# Patient Record
Sex: Female | Born: 1980 | Race: Black or African American | Hispanic: No | Marital: Single | Smoking: Former smoker
Health system: Southern US, Community
[De-identification: ages and names within clinical notes are randomized; demographics above are authoritative.]

## PROBLEM LIST (undated history)

## (undated) DIAGNOSIS — G4730 Sleep apnea, unspecified: Secondary | ICD-10-CM

## (undated) DIAGNOSIS — G35 Multiple sclerosis: Secondary | ICD-10-CM

## (undated) DIAGNOSIS — I10 Essential (primary) hypertension: Secondary | ICD-10-CM

## (undated) DIAGNOSIS — M1990 Unspecified osteoarthritis, unspecified site: Secondary | ICD-10-CM

## (undated) DIAGNOSIS — E669 Obesity, unspecified: Secondary | ICD-10-CM

## (undated) DIAGNOSIS — E789 Disorder of lipoprotein metabolism, unspecified: Secondary | ICD-10-CM

## (undated) DIAGNOSIS — K219 Gastro-esophageal reflux disease without esophagitis: Secondary | ICD-10-CM

## (undated) DIAGNOSIS — G4733 Obstructive sleep apnea (adult) (pediatric): Secondary | ICD-10-CM

## (undated) HISTORY — DX: Gastro-esophageal reflux disease without esophagitis: K21.9

## (undated) HISTORY — DX: Disorder of lipoprotein metabolism, unspecified: E78.9

## (undated) HISTORY — DX: Obesity, unspecified: E66.9

## (undated) HISTORY — DX: Sleep apnea, unspecified: G47.30

## (undated) HISTORY — DX: Unspecified osteoarthritis, unspecified site: M19.90

## (undated) HISTORY — DX: Obstructive sleep apnea (adult) (pediatric): G47.33

## (undated) HISTORY — PX: OTHER SURGICAL HISTORY: SHX169

---

## 2003-07-26 ENCOUNTER — Emergency Department (HOSPITAL_COMMUNITY): Admission: EM | Admit: 2003-07-26 | Discharge: 2003-07-26 | Payer: Self-pay | Admitting: *Deleted

## 2005-12-09 DIAGNOSIS — G35 Multiple sclerosis: Secondary | ICD-10-CM

## 2005-12-09 HISTORY — DX: Multiple sclerosis: G35

## 2008-12-18 ENCOUNTER — Ambulatory Visit (HOSPITAL_BASED_OUTPATIENT_CLINIC_OR_DEPARTMENT_OTHER): Admission: RE | Admit: 2008-12-18 | Discharge: 2008-12-18 | Payer: Self-pay | Admitting: Family Medicine

## 2008-12-18 ENCOUNTER — Ambulatory Visit: Payer: Self-pay | Admitting: Family Medicine

## 2008-12-18 ENCOUNTER — Ambulatory Visit: Payer: Self-pay | Admitting: Diagnostic Radiology

## 2008-12-18 DIAGNOSIS — I8000 Phlebitis and thrombophlebitis of superficial vessels of unspecified lower extremity: Secondary | ICD-10-CM | POA: Insufficient documentation

## 2008-12-18 DIAGNOSIS — I10 Essential (primary) hypertension: Secondary | ICD-10-CM | POA: Insufficient documentation

## 2008-12-18 DIAGNOSIS — R252 Cramp and spasm: Secondary | ICD-10-CM | POA: Insufficient documentation

## 2008-12-18 DIAGNOSIS — R609 Edema, unspecified: Secondary | ICD-10-CM | POA: Insufficient documentation

## 2013-01-01 DIAGNOSIS — E559 Vitamin D deficiency, unspecified: Secondary | ICD-10-CM | POA: Insufficient documentation

## 2014-04-29 DIAGNOSIS — N915 Oligomenorrhea, unspecified: Secondary | ICD-10-CM | POA: Insufficient documentation

## 2015-09-18 DIAGNOSIS — R200 Anesthesia of skin: Secondary | ICD-10-CM | POA: Insufficient documentation

## 2015-09-18 DIAGNOSIS — G4726 Circadian rhythm sleep disorder, shift work type: Secondary | ICD-10-CM | POA: Insufficient documentation

## 2015-09-18 DIAGNOSIS — G5600 Carpal tunnel syndrome, unspecified upper limb: Secondary | ICD-10-CM | POA: Insufficient documentation

## 2015-09-18 DIAGNOSIS — R413 Other amnesia: Secondary | ICD-10-CM | POA: Insufficient documentation

## 2015-09-18 DIAGNOSIS — R5383 Other fatigue: Secondary | ICD-10-CM | POA: Insufficient documentation

## 2015-09-18 DIAGNOSIS — R2681 Unsteadiness on feet: Secondary | ICD-10-CM | POA: Insufficient documentation

## 2015-09-18 DIAGNOSIS — R202 Paresthesia of skin: Secondary | ICD-10-CM | POA: Insufficient documentation

## 2015-10-28 DIAGNOSIS — R110 Nausea: Secondary | ICD-10-CM | POA: Diagnosis not present

## 2015-10-28 DIAGNOSIS — I10 Essential (primary) hypertension: Secondary | ICD-10-CM | POA: Diagnosis not present

## 2015-10-28 DIAGNOSIS — E559 Vitamin D deficiency, unspecified: Secondary | ICD-10-CM | POA: Diagnosis not present

## 2015-11-21 DIAGNOSIS — I10 Essential (primary) hypertension: Secondary | ICD-10-CM | POA: Diagnosis not present

## 2015-11-21 DIAGNOSIS — Z0000 Encounter for general adult medical examination without abnormal findings: Secondary | ICD-10-CM | POA: Diagnosis not present

## 2015-11-21 DIAGNOSIS — Z6833 Body mass index (BMI) 33.0-33.9, adult: Secondary | ICD-10-CM | POA: Diagnosis not present

## 2015-11-21 DIAGNOSIS — Z124 Encounter for screening for malignant neoplasm of cervix: Secondary | ICD-10-CM | POA: Diagnosis not present

## 2016-03-18 DIAGNOSIS — G35 Multiple sclerosis: Secondary | ICD-10-CM | POA: Diagnosis not present

## 2016-03-18 DIAGNOSIS — G4730 Sleep apnea, unspecified: Secondary | ICD-10-CM | POA: Diagnosis not present

## 2016-03-18 DIAGNOSIS — R2681 Unsteadiness on feet: Secondary | ICD-10-CM | POA: Diagnosis not present

## 2016-03-26 DIAGNOSIS — G35 Multiple sclerosis: Secondary | ICD-10-CM | POA: Diagnosis not present

## 2016-07-05 DIAGNOSIS — J209 Acute bronchitis, unspecified: Secondary | ICD-10-CM | POA: Diagnosis not present

## 2016-07-05 DIAGNOSIS — L600 Ingrowing nail: Secondary | ICD-10-CM | POA: Diagnosis not present

## 2016-07-07 DIAGNOSIS — G35 Multiple sclerosis: Secondary | ICD-10-CM | POA: Diagnosis not present

## 2016-07-07 DIAGNOSIS — L03031 Cellulitis of right toe: Secondary | ICD-10-CM | POA: Diagnosis not present

## 2016-07-07 DIAGNOSIS — J018 Other acute sinusitis: Secondary | ICD-10-CM | POA: Diagnosis not present

## 2016-07-07 DIAGNOSIS — L97511 Non-pressure chronic ulcer of other part of right foot limited to breakdown of skin: Secondary | ICD-10-CM | POA: Diagnosis not present

## 2016-07-13 DIAGNOSIS — G35 Multiple sclerosis: Secondary | ICD-10-CM | POA: Diagnosis not present

## 2016-07-13 DIAGNOSIS — R200 Anesthesia of skin: Secondary | ICD-10-CM | POA: Diagnosis not present

## 2016-07-13 DIAGNOSIS — R202 Paresthesia of skin: Secondary | ICD-10-CM | POA: Diagnosis not present

## 2016-08-10 DIAGNOSIS — G35 Multiple sclerosis: Secondary | ICD-10-CM | POA: Diagnosis not present

## 2016-08-10 DIAGNOSIS — R200 Anesthesia of skin: Secondary | ICD-10-CM | POA: Diagnosis not present

## 2016-08-10 DIAGNOSIS — R2681 Unsteadiness on feet: Secondary | ICD-10-CM | POA: Diagnosis not present

## 2016-08-10 DIAGNOSIS — R202 Paresthesia of skin: Secondary | ICD-10-CM | POA: Diagnosis not present

## 2016-10-13 DIAGNOSIS — R5383 Other fatigue: Secondary | ICD-10-CM | POA: Diagnosis not present

## 2016-10-13 DIAGNOSIS — R110 Nausea: Secondary | ICD-10-CM | POA: Diagnosis not present

## 2016-11-21 ENCOUNTER — Emergency Department
Admission: EM | Admit: 2016-11-21 | Discharge: 2016-11-21 | Payer: BLUE CROSS/BLUE SHIELD | Source: Home / Self Care | Attending: Family Medicine | Admitting: Family Medicine

## 2016-11-21 DIAGNOSIS — M79605 Pain in left leg: Secondary | ICD-10-CM | POA: Diagnosis not present

## 2016-11-21 DIAGNOSIS — M79662 Pain in left lower leg: Secondary | ICD-10-CM

## 2016-11-21 DIAGNOSIS — I10 Essential (primary) hypertension: Secondary | ICD-10-CM | POA: Diagnosis not present

## 2016-11-21 DIAGNOSIS — M7989 Other specified soft tissue disorders: Secondary | ICD-10-CM | POA: Diagnosis not present

## 2016-11-21 DIAGNOSIS — G35 Multiple sclerosis: Secondary | ICD-10-CM | POA: Diagnosis not present

## 2016-11-21 DIAGNOSIS — Z87891 Personal history of nicotine dependence: Secondary | ICD-10-CM | POA: Diagnosis not present

## 2016-11-21 HISTORY — DX: Multiple sclerosis: G35

## 2016-11-21 HISTORY — DX: Essential (primary) hypertension: I10

## 2016-11-21 NOTE — ED Triage Notes (Signed)
Pt states pain started in left hip 1 week ago, has radiated into left calf and her calf has been swollen since Friday. Pt states it is getting harder to move leg. Pt denies left leg feeling warmer than her right and does wear compression stockings but hasn't since the swelling has increased.

## 2016-11-21 NOTE — ED Provider Notes (Signed)
[facility]    CSN: [id number] Arrival date & time: 11/21/16  1637     History   Chief Complaint Chief Complaint  Patient presents with  . Hip Pain  . Leg Pain    HPI Jean Yu is a 36 y.o. female.   Patient complains of onset of vague pain in left hip and anterior thigh one week ago.  No preceding injury.  Over the past several days the pain began to radiate down her left thigh to her knee.  Today she has developed vague pain and swelling in her left posterior calf to the ankle.  Her pain is worse when walking.  No shortness of breath or chest pain.   The history is provided by the patient.  Hip Pain  This is a new problem. Episode onset: 1 week ago. The problem occurs constantly. The problem has been rapidly worsening. Pertinent negatives include no chest pain, no headaches and no shortness of breath. The symptoms are aggravated by walking. Nothing relieves the symptoms. Treatments tried: leg elevation. The treatment provided no relief.  Leg Pain  Associated symptoms: fatigue   Associated symptoms: no fever     Past Medical History:  Diagnosis Date  . Hypertension   . MS (multiple sclerosis) (HCC) 12/09/2005    Patient Active Problem List   Diagnosis Date Noted  . HYPERTENSION 12/18/2008  . PHLEBITIS&THROMBOPHLEB SUP VESSELS LOWER EXTREM 12/18/2008  . CRAMP IN LIMB 12/18/2008  . EDEMA LEG 12/18/2008    History reviewed. No pertinent surgical history.  OB History    No data available       Home Medications    Prior to Admission medications   Medication Sig Start Date End Date Taking? Authorizing Provider  amLODipine-benazepril (LOTREL) 10-40 MG capsule Take 1 capsule by mouth daily.   Yes [provider]  cholecalciferol (VITAMIN D) 1000 units tablet Take 1,000 Units by mouth daily.   Yes [provider]  Dimethyl Fumarate (TECFIDERA) 240 MG CPDR Take by mouth.   Yes [provider]  metoprolol succinate  (TOPROL-XL) 50 MG 24 hr tablet Take 50 mg by mouth daily. Take with or immediately following a meal.   Yes [provider]  Multiple Vitamins-Minerals (HAIR/SKIN/NAILS) TABS Take by mouth.   Yes [provider]  triamterene-hydrochlorothiazide (DYAZIDE) 37.5-25 MG capsule Take 1 capsule by mouth daily.   Yes [provider]  vitamin B-12 (CYANOCOBALAMIN) 500 MCG tablet Take 500 mcg by mouth daily.   Yes [provider]    Family History Family History  Problem Relation Age of Onset  . Hypertension Mother     Social History Social History  Substance Use Topics  . Smoking status: Never Smoker  . Smokeless tobacco: Never Used  . Alcohol use 0.0 oz/week     Allergies   Patient has no known allergies.   Review of Systems Review of Systems  Constitutional: Positive for fatigue. Negative for chills, diaphoresis and fever.  HENT: Negative.   Eyes: Negative.   Respiratory: Negative for shortness of breath.   Cardiovascular: Negative for chest pain.  Gastrointestinal: Negative.   Genitourinary: Negative.   Musculoskeletal:       Left lower leg pain and swelling.  Skin: Negative.   Neurological: Negative for headaches.  All other systems reviewed and are negative.    Physical Exam Triage Vital Signs ED Triage Vitals  Enc Vitals Group     BP 11/21/16 1657 119/80     Pulse Rate  11/21/16 1657 75     Resp 11/21/16 1657 18     Temp 11/21/16 1657 98.6 F (37 C)     Temp Source 11/21/16 1657 Oral     SpO2 11/21/16 1657 97 %     Weight 11/21/16 1658 252 lb (114.3 kg)     Height 11/21/16 1658 5' 11.5" (1.816 m)     Head Circumference --      Peak Flow --      Pain Score 11/21/16 1659 8     Pain Loc --      Pain Edu? --      Excl. in GC? --    No data found.   Updated Vital Signs BP 119/80 (BP Location: Left Arm)   Pulse 75   Temp 98.6 F (37 C) (Oral)   Resp 18   Ht 5' 11.5" (1.816 m)   Wt 252 lb (114.3 kg)   LMP 11/17/2016    SpO2 97%   BMI 34.66 kg/m   Visual Acuity Right Eye Distance:   Left Eye Distance:   Bilateral Distance:    Right Eye Near:   Left Eye Near:    Bilateral Near:     Physical Exam  Constitutional: She appears well-nourished. No distress.  HENT:  Head: Normocephalic.  Nose: Nose normal.  Mouth/Throat: Oropharynx is clear and moist.  Eyes: Conjunctivae are normal. Pupils are equal, round, and reactive to light.  Neck: Neck supple.  Cardiovascular: Normal heart sounds.   Pulmonary/Chest: Breath sounds normal.  Abdominal: Soft. There is no tenderness.  Musculoskeletal: She exhibits tenderness.       Left lower leg: She exhibits tenderness. She exhibits no bony tenderness.       Legs: Left leg is tender to palpation and swollen from lower calf to proximal posterior thigh.  No erythema or warmth.  Pedal pulses are intact.  Neurological: She is alert.  Skin: Skin is warm and dry.  Nursing note and vitals reviewed.    UC Treatments / Results  Labs (all labs ordered are listed, but only abnormal results are displayed) Labs Reviewed - No data to display  EKG  EKG Interpretation None       Radiology No results found.  Procedures Procedures (including critical care time)  Medications Ordered in UC Medications - No data to display   Initial Impression / Assessment and Plan / UC Course  I have reviewed the triage vital signs and the nursing notes.  Pertinent labs & imaging results that were available during my care of the patient were reviewed by me and considered in my medical decision making (see chart for details).    Concern for possible DVT. Advised to proceed immediately to [facility] ED.    Final Clinical Impressions(s) / UC Diagnoses   Final diagnoses:  Pain in left lower leg    New Prescriptions New Prescriptions   No medications on file     Lattie Haw, MD 11/21/16 1759

## 2016-12-31 DIAGNOSIS — G4733 Obstructive sleep apnea (adult) (pediatric): Secondary | ICD-10-CM | POA: Diagnosis not present

## 2017-01-04 ENCOUNTER — Ambulatory Visit: Payer: Self-pay | Admitting: Physician Assistant

## 2017-01-19 DIAGNOSIS — G4733 Obstructive sleep apnea (adult) (pediatric): Secondary | ICD-10-CM | POA: Diagnosis not present

## 2017-01-19 DIAGNOSIS — R2681 Unsteadiness on feet: Secondary | ICD-10-CM | POA: Diagnosis not present

## 2017-01-19 DIAGNOSIS — G35 Multiple sclerosis: Secondary | ICD-10-CM | POA: Diagnosis not present

## 2017-01-20 ENCOUNTER — Ambulatory Visit: Payer: BLUE CROSS/BLUE SHIELD | Admitting: Physician Assistant

## 2017-02-07 ENCOUNTER — Ambulatory Visit: Payer: BLUE CROSS/BLUE SHIELD | Admitting: Physician Assistant

## 2017-03-14 ENCOUNTER — Ambulatory Visit: Payer: BLUE CROSS/BLUE SHIELD | Admitting: Physician Assistant

## 2017-03-29 ENCOUNTER — Encounter: Payer: Self-pay | Admitting: Physician Assistant

## 2017-03-29 ENCOUNTER — Ambulatory Visit (INDEPENDENT_AMBULATORY_CARE_PROVIDER_SITE_OTHER): Payer: BLUE CROSS/BLUE SHIELD | Admitting: Physician Assistant

## 2017-03-29 VITALS — BP 142/88 | HR 66 | Ht 72.0 in | Wt 250.0 lb

## 2017-03-29 DIAGNOSIS — Z23 Encounter for immunization: Secondary | ICD-10-CM | POA: Diagnosis not present

## 2017-03-29 DIAGNOSIS — M722 Plantar fascial fibromatosis: Secondary | ICD-10-CM

## 2017-03-29 DIAGNOSIS — I10 Essential (primary) hypertension: Secondary | ICD-10-CM | POA: Diagnosis not present

## 2017-03-29 DIAGNOSIS — G35D Multiple sclerosis, unspecified: Secondary | ICD-10-CM

## 2017-03-29 DIAGNOSIS — G35 Multiple sclerosis: Secondary | ICD-10-CM

## 2017-03-29 DIAGNOSIS — Z6833 Body mass index (BMI) 33.0-33.9, adult: Secondary | ICD-10-CM

## 2017-03-29 MED ORDER — MELOXICAM 15 MG PO TABS: 15.0000 mg | ORAL_TABLET | Freq: Every day | ORAL | 1 refills | Status: DC

## 2017-03-29 MED ORDER — METOPROLOL SUCCINATE ER 50 MG PO TB24: 50.0000 mg | ORAL_TABLET | Freq: Every day | ORAL | 1 refills | Status: DC

## 2017-03-29 MED ORDER — TRIAMTERENE-HCTZ 37.5-25 MG PO CAPS: 1.0000 | ORAL_CAPSULE | Freq: Every day | ORAL | 1 refills | Status: DC

## 2017-03-29 NOTE — Patient Instructions (Signed)
Plantar Fasciitis Plantar fasciitis is a painful foot condition that affects the heel. It occurs when the band of tissue that connects the toes to the heel bone (plantar fascia) becomes irritated. This can happen after exercising too much or doing other repetitive activities (overuse injury). The pain from plantar fasciitis can range from mild irritation to severe pain that makes it difficult for you to walk or move. The pain is usually worse in the morning or after you have been sitting or lying down for a while. What are the causes? This condition may be caused by:  Standing for long periods of time.  Wearing shoes that do not fit.  Doing high-impact activities, including running, aerobics, and ballet.  Being overweight.  Having an abnormal way of walking (gait).  Having tight calf muscles.  Having high arches in your feet.  Starting a new athletic activity.  What are the signs or symptoms? The main symptom of this condition is heel pain. Other symptoms include:  Pain that gets worse after activity or exercise.  Pain that is worse in the morning or after resting.  Pain that goes away after you walk for a few minutes.  How is this diagnosed? This condition may be diagnosed based on your signs and symptoms. Your health care provider will also do a physical exam to check for:  A tender area on the bottom of your foot.  A high arch in your foot.  Pain when you move your foot.  Difficulty moving your foot.  You may also need to have imaging studies to confirm the diagnosis. These can include:  X-rays.  Ultrasound.  MRI.  How is this treated? Treatment for plantar fasciitis depends on the severity of the condition. Your treatment may include:  Rest, ice, and over-the-counter pain medicines to manage your pain.  Exercises to stretch your calves and your plantar fascia.  A splint that holds your foot in a stretched, upward position while you sleep (night  splint).  Physical therapy to relieve symptoms and prevent problems in the future.  Cortisone injections to relieve severe pain.  Extracorporeal shock wave therapy (ESWT) to stimulate damaged plantar fascia with electrical impulses. It is often used as a last resort before surgery.  Surgery, if other treatments have not worked after 12 months.  Follow these instructions at home:  Take medicines only as directed by your health care provider.  Avoid activities that cause pain.  Roll the bottom of your foot over a bag of ice or a bottle of cold water. Do this for 20 minutes, 3-4 times a day.  Perform simple stretches as directed by your health care provider.  Try wearing athletic shoes with air-sole or gel-sole cushions or soft shoe inserts.  Wear a night splint while sleeping, if directed by your health care provider.  Keep all follow-up appointments with your health care provider. How is this prevented?  Do not perform exercises or activities that cause heel pain.  Consider finding low-impact activities if you continue to have problems.  Lose weight if you need to. The best way to prevent plantar fasciitis is to avoid the activities that aggravate your plantar fascia. Contact a health care provider if:  Your symptoms do not go away after treatment with home care measures.  Your pain gets worse.  Your pain affects your ability to move or do your daily activities. This information is not intended to replace advice given to you by your health care provider. Make sure you   discuss any questions you have with your health care provider. Document Released: 03/09/2001 Document Revised: 11/17/2015 Document Reviewed: 04/24/2014 Elsevier Interactive Patient Education  2018 [employer] Inc[employer]   Plantar Fasciitis Rehab Ask your health care provider which exercises are safe for you. Do exercises exactly as told by your health care provider and adjust them as directed. It is normal to feel  mild stretching, pulling, tightness, or discomfort as you do these exercises, but you should stop right away if you feel sudden pain or your pain gets worse. Do not begin these exercises until told by your health care provider. Stretching and range of motion exercises These exercises warm up your muscles and joints and improve the movement and flexibility of your foot. These exercises also help to relieve pain. Exercise A: Plantar fascia stretch  1. Sit with your left / right leg crossed over your opposite knee. 2. Hold your heel with one hand with that thumb near your arch. With your other hand, hold your toes and gently pull them back toward the top of your foot. You should feel a stretch on the bottom of your toes or your foot or both. 3. Hold this stretch for__________ seconds. 4. Slowly release your toes and return to the starting position. Repeat __________ times. Complete this exercise __________ times a day. Exercise B: Gastroc, standing  1. Stand with your hands against a wall. 2. Extend your left / right leg behind you, and bend your front knee slightly. 3. Keeping your heels on the floor and keeping your back knee straight, shift your weight toward the wall without arching your back. You should feel a gentle stretch in your left / right calf. 4. Hold this position for __________ seconds. Repeat __________ times. Complete this exercise __________ times a day. Exercise C: Soleus, standing 1. Stand with your hands against a wall. 2. Extend your left / right leg behind you, and bend your front knee slightly. 3. Keeping your heels on the floor, bend your back knee and slightly shift your weight over the back leg. You should feel a gentle stretch deep in your calf. 4. Hold this position for __________ seconds. Repeat __________ times. Complete this exercise __________ times a day. Exercise D: Gastrocsoleus, standing 1. Stand with the ball of your left / right foot on a step. The ball of  your foot is on the walking surface, right under your toes. 2. Keep your other foot firmly on the same step. 3. Hold onto the wall or a railing for balance. 4. Slowly lift your other foot, allowing your body weight to press your heel down over the edge of the step. You should feel a stretch in your left / right calf. 5. Hold this position for __________ seconds. 6. Return both feet to the step. 7. Repeat this exercise with a slight bend in your left / right knee. Repeat __________ times with your left / right knee straight and __________ times with your left / right knee bent. Complete this exercise __________ times a day. Balance exercise This exercise builds your balance and strength control of your arch to help take pressure off your plantar fascia. Exercise E: Single leg stand 1. Without shoes, stand near a railing or in a doorway. You may hold onto the railing or door frame as needed. 2. Stand on your left / right foot. Keep your big toe down on the floor and try to keep your arch lifted. Do not let your foot roll inward. 3. Hold   this position for __________ seconds. 4. If this exercise is too easy, you can try it with your eyes closed or while standing on a pillow. Repeat __________ times. Complete this exercise __________ times a day. This information is not intended to replace advice given to you by your health care provider. Make sure you discuss any questions you have with your health care provider. Document Released: 06/14/2005 Document Revised: 02/17/2016 Document Reviewed: 04/28/2015 Elsevier Interactive Patient Education  2018 Elsevier Inc.   

## 2017-03-29 NOTE — Progress Notes (Signed)
Subjective:    Patient ID: Jean Yu, female    DOB: 09-Nov-1980, 36 y.o.   MRN: [id number]  HPI  Pt is a 36 yo pleasant female who presents to the clinic to establish care.   Pt has a long hx of hypertension. She has been on numerous medications. Recently, about 2 months ago, she stopped taking amlodipine and benzapril. Her BP's have stayed under 140/90 when she checks at home. She denies any CP, palpitations, headaches. She is concerned about her cardiac health as her mother suddenly at 23 yo. She is working out 2 times and sometimes more a week at [employer]. She is trying to think about better food choices.  She would like to discuss weight loss in future.   She would like to discuss right foot pain today. Started when she woke up Saturday morning. Continues to get worse. Hurts when she bears any weight on it. Hurts worse if she trys to stand on tip toes. She is taking ibuprofen with little relief. No known injury but she has been working out more.  .. Active Ambulatory Problems    Diagnosis Date Noted  . Hypertension 12/18/2008  . MS (multiple sclerosis) (HCC) 12/09/2005  . BMI 33.0-33.9,adult 03/29/2017   Resolved Ambulatory Problems    Diagnosis Date Noted  . PHLEBITIS&THROMBOPHLEB SUP VESSELS LOWER EXTREM 12/18/2008  . CRAMP IN LIMB 12/18/2008  . EDEMA LEG 12/18/2008   Past Medical History:  Diagnosis Date  . Hypertension   . MS (multiple sclerosis) (HCC) 12/09/2005   .[name] Family History  Problem Relation Age of Onset  . Hypertension Mother   . Heart attack Mother   . Cancer Maternal Aunt        Review of Systems  All other systems reviewed and are negative.      Objective:   Physical Exam  Constitutional: She is oriented to person, place, and time. She appears well-developed and well-nourished.  Obese.   Cardiovascular: Normal rate, regular rhythm and normal heart sounds.   Pulmonary/Chest: Effort normal and breath sounds normal.  Musculoskeletal:   Pain to palpation over plantar fascia of right foot.  Pain with plantar flexion.  Pain with weight bearing.  NROM.   Neurological: She is alert and oriented to person, place, and time.  Psychiatric: She has a normal mood and affect. Her behavior is normal.          Assessment & Plan:  Marland KitchenMarland KitchenJaynae was seen today for establish care and hypertension.  Diagnoses and all orders for this visit:  Plantar fasciitis, right -     meloxicam (MOBIC) 15 MG tablet; Take 1 tablet (15 mg total) by mouth daily.  Influenza vaccine needed -     Flu Vaccine QUAD 6+ mos PF IM (Fluarix Quad PF)  Essential hypertension -     metoprolol succinate (TOPROL-XL) 50 MG 24 hr tablet; Take 1 tablet (50 mg total) by mouth daily. Take with or immediately following a meal. -     triamterene-hydrochlorothiazide (DYAZIDE) 37.5-25 MG capsule; Take 1 each (1 capsule total) by mouth daily.  MS (multiple sclerosis) (HCC)  BMI 33.0-33.9,adult   Stop ibuprofen. Start mobic. Discussed icing with frozen water bottle. Pt is wearing sandles today encouraged to get supportive shoe and wear all the time. Rest for next week. Consider injection/orthotics if not improving with sports medicine.   BP not quite to goal. Pt does not want to restart CCB and ACE. Close follow up on HTN in 4 weeks. Discussed  decreasing salt as well as weight loss.   [name].Discussed low carb diet with 1500 calories and 80g of protein.  Exercising at least 150 minutes a week.  [employer] could be a Chief Technology Officer.  Discussed medications will hold off for now and consider in near future if not to goal.

## 2017-03-31 DIAGNOSIS — G35 Multiple sclerosis: Secondary | ICD-10-CM | POA: Diagnosis not present

## 2017-03-31 DIAGNOSIS — G5603 Carpal tunnel syndrome, bilateral upper limbs: Secondary | ICD-10-CM | POA: Diagnosis not present

## 2017-03-31 DIAGNOSIS — G4733 Obstructive sleep apnea (adult) (pediatric): Secondary | ICD-10-CM | POA: Diagnosis not present

## 2017-04-01 ENCOUNTER — Institutional Professional Consult (permissible substitution): Payer: BLUE CROSS/BLUE SHIELD | Admitting: Sports Medicine

## 2017-04-29 ENCOUNTER — Ambulatory Visit: Payer: BLUE CROSS/BLUE SHIELD | Admitting: Physician Assistant

## 2017-05-04 ENCOUNTER — Encounter: Payer: Self-pay | Admitting: Physician Assistant

## 2017-05-04 ENCOUNTER — Ambulatory Visit: Payer: BLUE CROSS/BLUE SHIELD | Admitting: Physician Assistant

## 2017-05-04 VITALS — BP 118/80 | HR 76 | Wt 252.0 lb

## 2017-05-04 DIAGNOSIS — I10 Essential (primary) hypertension: Secondary | ICD-10-CM | POA: Diagnosis not present

## 2017-05-04 DIAGNOSIS — R079 Chest pain, unspecified: Secondary | ICD-10-CM | POA: Diagnosis not present

## 2017-05-04 DIAGNOSIS — G4733 Obstructive sleep apnea (adult) (pediatric): Secondary | ICD-10-CM

## 2017-05-04 DIAGNOSIS — Z8241 Family history of sudden cardiac death: Secondary | ICD-10-CM

## 2017-05-04 NOTE — Progress Notes (Signed)
HPI:                                                                Jean Yu is a 36 y.o. [facility] today for elevated blood pressure  HTN: taking Dyazide and Metoprolol daily. Compliant with medications. Reports she was sent home from work yesterday with BP of 180's/120 measured by nurse. Reports multiple high readings at home. BP range 127/86, 178/88, 160/112. Endorses headache, chest discomfort and lightheadedness. Denies vision change, focal weakness, slurred speech, orthopnea, syncope and edema. Risk factors include: OSA, obesity, + family history  Chest pain: this is a chronic problem, present for years. Described as left-sided "pressure." Intermittent, sometimes at rest and with minimal exertion. She is able to exercise without chest pain. Non-pleuritic. No associated diaphoresis, nausea, shortness of breath, palpitations or abnormal beats. She was evaluated by Cardiology 08/2015, reported similar chest pain at that time. She had a negative stress echo in 2015. Last lipid panel 10/12/2016: TC 288, HDL 78, TG 126, LDL 125  OSA: currently being followed by sleep medicine and has an upcoming sleep study with CPAP titration.   Past Medical History:  Diagnosis Date  . Borderline high cholesterol   . Hypertension   . MS (multiple sclerosis) (HCC) 12/09/2005  . OSA (obstructive sleep apnea)    No past surgical history on file. Social History   Tobacco Use  . Smoking status: Former Games developermoker  . Smokeless tobacco: Never Used  Substance Use Topics  . Alcohol use: Yes    Alcohol/week: 0.0 oz   family history includes Cancer in her maternal aunt; Heart attack in her mother; Hypertension in her mother.  ROS: negative except as noted in the HPI  Medications: Current Outpatient Medications  Medication Sig Dispense Refill  . cholecalciferol (VITAMIN D) 1000 units tablet Take 1,000 Units by mouth daily.    .  Dimethyl Fumarate (TECFIDERA) 240 MG CPDR Take by mouth.    . metoprolol succinate (TOPROL-XL) 50 MG 24 hr tablet Take 1 tablet (50 mg total) by mouth daily. Take with or immediately following a meal. 90 tablet 1  . triamterene-hydrochlorothiazide (DYAZIDE) 37.5-25 MG capsule Take 1 each (1 capsule total) by mouth daily. 90 capsule 1   No current facility-administered medications for this visit.    No Known Allergies     Objective:  BP 118/85   Pulse 83   Wt 252 lb (114.3 kg)   SpO2 97%   BMI 34.18 kg/m  Gen:  alert, not ill-appearing, no distress, appropriate for age, obese female HEENT: head normocephalic without obvious abnormality, conjunctiva and cornea clear, trachea midline Pulm: Normal work of breathing, normal phonation, clear to auscultation bilaterally, no wheezes, rales or rhonchi CV: Normal rate, regular rhythm, s1 and s2 distinct, no murmurs, clicks or rubs, no carotid bruit  Neuro: alert and oriented x 3, no tremor MSK: extremities atraumatic, normal gait and station Skin: intact, no rashes on exposed skin, no cyanosis  Depression screen PHQ 2/9 03/29/2017  Decreased Interest 0  Down, Depressed, Hopeless 0  PHQ - 2 Score 0     No results found for this or any previous visit (from the past 72 hour(s)). No results found.  ECG  05/04/2017 Vent Rate 74 bpm PR-I 160 ms QRS 94 ms QT/QTc 406/450 ms Normal sinus rhythm  Assessment and Plan: 35 y.o. female with   1. Essential hypertension - EKG 12-Lead  2. Elevated blood pressure reading with diagnosis of hypertension BP Readings from Last 3 Encounters:  05/04/17 118/80  03/29/17 (!) 142/88  11/21/16 119/80  - office readings in range x 2 - discussed treating her OSA will likely improve her hypertension - continue daily meds - counseled on therapeutic lifestyle changes - patient to log readings at home and bring cuff to her follow-up appointment  3. Obstructive sleep apnea - maintain follow-up with  sleep medicine  4. Family history of sudden cardiac death in mother  70. Chest pain with low risk of ACS - personally reviewed ECG showing NSR, no dysrhythmia, no AV block, no ST changes, no axis deviation - vital signs normal - longstanding history of atypical chest pain, negative stress echo in the last 5 years, no HLD or CAD - recommend follow-up with Cardiology to discuss possible repeat stress test  Patient education and anticipatory guidance given Patient agrees with treatment plan Follow-up with PCP in 3 weeks for hypertension or sooner as needed if symptoms worsen or fail to improve  Levonne Hubert PA-C

## 2017-05-04 NOTE — Patient Instructions (Addendum)
For your blood pressure: - Check blood pressure at home for the next 2 weeks - Check around the same time each day in a relaxed setting - Limit salt to <1500 mg/day - Follow DASH eating plan - limit alcohol to 1 standard drinks per day - avoid tobacco products - weight loss: 7% of current body weight - bring home readings in weeks

## 2017-05-25 ENCOUNTER — Ambulatory Visit: Payer: BLUE CROSS/BLUE SHIELD | Admitting: Physician Assistant

## 2017-06-17 ENCOUNTER — Ambulatory Visit: Payer: BLUE CROSS/BLUE SHIELD | Admitting: Physician Assistant

## 2017-06-17 DIAGNOSIS — Z0189 Encounter for other specified special examinations: Secondary | ICD-10-CM

## 2017-08-03 ENCOUNTER — Ambulatory Visit: Payer: BLUE CROSS/BLUE SHIELD | Admitting: Physician Assistant

## 2017-08-29 ENCOUNTER — Ambulatory Visit: Payer: BLUE CROSS/BLUE SHIELD | Admitting: Physician Assistant

## 2017-08-29 ENCOUNTER — Encounter: Payer: Self-pay | Admitting: Physician Assistant

## 2017-08-29 VITALS — BP 145/88 | HR 67 | Ht 72.0 in | Wt 255.0 lb

## 2017-08-29 DIAGNOSIS — Z79899 Other long term (current) drug therapy: Secondary | ICD-10-CM

## 2017-08-29 DIAGNOSIS — I10 Essential (primary) hypertension: Secondary | ICD-10-CM

## 2017-08-29 DIAGNOSIS — Z13220 Encounter for screening for lipoid disorders: Secondary | ICD-10-CM

## 2017-08-29 DIAGNOSIS — Z131 Encounter for screening for diabetes mellitus: Secondary | ICD-10-CM

## 2017-08-29 DIAGNOSIS — E6609 Other obesity due to excess calories: Secondary | ICD-10-CM | POA: Insufficient documentation

## 2017-08-29 DIAGNOSIS — Z6834 Body mass index (BMI) 34.0-34.9, adult: Secondary | ICD-10-CM | POA: Diagnosis not present

## 2017-08-29 DIAGNOSIS — N62 Hypertrophy of breast: Secondary | ICD-10-CM | POA: Insufficient documentation

## 2017-08-29 MED ORDER — METOPROLOL SUCCINATE ER 50 MG PO TB24: 50.0000 mg | ORAL_TABLET | Freq: Every day | ORAL | 1 refills | Status: DC

## 2017-08-29 MED ORDER — TRIAMTERENE-HCTZ 37.5-25 MG PO CAPS: 1.0000 | ORAL_CAPSULE | Freq: Every day | ORAL | 1 refills | Status: DC

## 2017-08-29 NOTE — Progress Notes (Signed)
Subjective:    Patient ID: Jean Yu, female    DOB: 25-Sep-1980, 37 y.o.   MRN: [id number]  HPI  Pt is a 37 yo obese female with HTN, MS, and OSA who presents to the clinic for BP follow up.   HTN- denies any CP, palpitations, headaches, vision changes. She took both medications this am. She does check at home her BP ranges from 110's to high 130's over 70-80's. She does report to very stressed today. She is going to have to fire a friend today.   OSA- she still does not have a CPAP machine. She sees sleep medicine on march 22nd and they are doing more testing.   Pt continues to desire to lose weight. She has bilateral large breast and having a lot of back pain. She is not exercising. She is trying to watch portion control. Reports bra size G's.   .. Active Ambulatory Problems    Diagnosis Date Noted  . Hypertension 12/18/2008  . MS (multiple sclerosis) (HCC) 12/09/2005  . BMI 33.0-33.9,adult 03/29/2017  . Obstructive sleep apnea 05/15/17  . Family history of sudden cardiac death in mother May 15, 2017  . Chest pain with low risk of acute coronary syndrome 2017/05/15  . Large breasts 08/29/2017  . Class 1 obesity due to excess calories with serious comorbidity and body mass index (BMI) of 34.0 to 34.9 in adult 08/29/2017   Resolved Ambulatory Problems    Diagnosis Date Noted  . PHLEBITIS&THROMBOPHLEB SUP VESSELS LOWER EXTREM 12/18/2008  . CRAMP IN LIMB 12/18/2008  . EDEMA LEG 12/18/2008   Past Medical History:  Diagnosis Date  . Borderline high cholesterol   . Hypertension   . MS (multiple sclerosis) (HCC) 12/09/2005  . Obesity   . OSA (obstructive sleep apnea)       Review of Systems  All other systems reviewed and are negative.      Objective:   Physical Exam  Constitutional: She is oriented to person, place, and time. She appears well-developed and well-nourished.  HENT:  Head: Normocephalic and atraumatic.  Eyes: Conjunctivae are normal.  Neck: Normal  range of motion. Neck supple.  Cardiovascular: Normal rate, regular rhythm and normal heart sounds.  Pulmonary/Chest: Effort normal and breath sounds normal.  Large breast.   Musculoskeletal:  Shoulder notching.  Upper back tightness of muscles.   Neurological: She is alert and oriented to person, place, and time.  Skin: Skin is dry.  Psychiatric: She has a normal mood and affect. Her behavior is normal.          Assessment & Plan:  Marland KitchenMarland KitchenRever was seen today for hypertension.  Diagnoses and all orders for this visit:  Essential hypertension -     metoprolol succinate (TOPROL-XL) 50 MG 24 hr tablet; Take 1 tablet (50 mg total) by mouth daily. Take with or immediately following a meal. -     triamterene-hydrochlorothiazide (DYAZIDE) 37.5-25 MG capsule; Take 1 each (1 capsule total) by mouth daily. -     Lipid Panel w/reflex Direct LDL -     COMPLETE METABOLIC PANEL WITH GFR  Screening for lipid disorders -     Lipid Panel w/reflex Direct LDL  Screening for diabetes mellitus -     COMPLETE METABOLIC PANEL WITH GFR  Medication management -     TSH -     CBC with Differential/Platelet -     B12 -     Vitamin D 1,25 dihydroxy  Class 1 obesity due to excess calories with  serious comorbidity and body mass index (BMI) of 34.0 to 34.9 in adult -     TSH  Large breasts   BP not controlled today. 2nd recheck was the same. She has good readings in office and at home recheck. Pt will report next 2 week readings via mychart to decide if medication adjustments need to be made.   [name].Discussed low carb diet with 1500 calories and 80g of protein.  Exercising at least 150 minutes a week.  [employer] could be a Chief Technology Officer.  Discussed medication options. Pt declines today.  I certainly fill like breast reduction would be therapeutic for patient. She is not ready for that yet.   Labs ordered.   Certainly OSA could help improve BP and energy. Hopefully advancement in getting CPAP  will occur soon.

## 2017-08-29 NOTE — Patient Instructions (Signed)
belviq-long term/pill saxenda- long term/injection Phentermine-pill/short term

## 2017-08-31 LAB — LIPID PANEL W/REFLEX DIRECT LDL
Cholesterol: 205 mg/dL — ABNORMAL HIGH (ref <200)
HDL: 74 mg/dL (ref >50)
LDL Cholesterol (Calc): 116 mg/dL (calc) — ABNORMAL HIGH
NON-HDL CHOLESTEROL (CALC): 131 mg/dL — AB (ref <130)
Total CHOL/HDL Ratio: 2.8 (calc) (ref <5.0)
Triglycerides: 56 mg/dL (ref <150)

## 2017-08-31 LAB — VITAMIN B12: Vitamin B-12: 1261 pg/mL — ABNORMAL HIGH (ref 200–1100)

## 2017-08-31 LAB — COMPLETE METABOLIC PANEL WITH GFR
AG Ratio: 1.4 (calc) (ref 1.0–2.5)
ALT: 15 U/L (ref 6–29)
AST: 14 U/L (ref 10–30)
Albumin: 4.2 g/dL (ref 3.6–5.1)
Alkaline phosphatase (APISO): 44 U/L (ref 33–115)
BUN: 13 mg/dL (ref 7–25)
CALCIUM: 9.2 mg/dL (ref 8.6–10.2)
CO2: 29 mmol/L (ref 20–32)
Chloride: 102 mmol/L (ref 98–110)
Creat: 0.87 mg/dL (ref 0.50–1.10)
GFR, EST AFRICAN AMERICAN: 99 mL/min/{1.73_m2} (ref >=60)
GFR, EST NON AFRICAN AMERICAN: 86 mL/min/{1.73_m2} (ref >=60)
GLUCOSE: 82 mg/dL (ref 65–99)
Globulin: 2.9 g/dL (calc) (ref 1.9–3.7)
Potassium: 3.6 mmol/L (ref 3.5–5.3)
Sodium: 139 mmol/L (ref 135–146)
TOTAL PROTEIN: 7.1 g/dL (ref 6.1–8.1)
Total Bilirubin: 0.6 mg/dL (ref 0.2–1.2)

## 2017-08-31 LAB — CBC WITH DIFFERENTIAL/PLATELET
BASOS PCT: 0.8 %
Basophils Absolute: 21 cells/uL (ref 0–200)
EOS ABS: 70 {cells}/uL (ref 15–500)
Eosinophils Relative: 2.7 %
HEMATOCRIT: 35.5 % (ref 35.0–45.0)
HEMOGLOBIN: 12.1 g/dL (ref 11.7–15.5)
LYMPHS ABS: 928 {cells}/uL (ref 850–3900)
MCH: 28.5 pg (ref 27.0–33.0)
MCHC: 34.1 g/dL (ref 32.0–36.0)
MCV: 83.5 fL (ref 80.0–100.0)
MPV: 11.9 fL (ref 7.5–12.5)
Monocytes Relative: 13.3 %
Neutro Abs: 1235 cells/uL — ABNORMAL LOW (ref 1500–7800)
Neutrophils Relative %: 47.5 %
Platelets: 173 10*3/uL (ref 140–400)
RBC: 4.25 10*6/uL (ref 3.80–5.10)
RDW: 11.9 % (ref 11.0–15.0)
Total Lymphocyte: 35.7 %
WBC: 2.6 10*3/uL — ABNORMAL LOW (ref 3.8–10.8)
WBCMIX: 346 {cells}/uL (ref 200–950)

## 2017-08-31 LAB — VITAMIN D 1,25 DIHYDROXY
VITAMIN D3 1, 25 (OH): 29 pg/mL
Vitamin D 1, 25 (OH)2 Total: 29 pg/mL (ref 18–72)

## 2017-08-31 LAB — TSH: TSH: 0.53 mIU/L

## 2017-08-31 NOTE — Progress Notes (Signed)
Call pt: cholesterol looks good. HDL wonderful.  Thyroid normal.  WBC is lower than should be likely from MS medication.  Vitamin b12 normal.  Vitamin D a little low. Take d3 1000 units daily.

## 2017-11-16 DIAGNOSIS — G4733 Obstructive sleep apnea (adult) (pediatric): Secondary | ICD-10-CM | POA: Diagnosis not present

## 2017-11-16 DIAGNOSIS — Z79899 Other long term (current) drug therapy: Secondary | ICD-10-CM | POA: Insufficient documentation

## 2017-11-16 DIAGNOSIS — G35 Multiple sclerosis: Secondary | ICD-10-CM | POA: Diagnosis not present

## 2017-11-16 DIAGNOSIS — R200 Anesthesia of skin: Secondary | ICD-10-CM | POA: Diagnosis not present

## 2017-11-17 ENCOUNTER — Encounter: Payer: Self-pay | Admitting: Physician Assistant

## 2017-11-17 ENCOUNTER — Ambulatory Visit (INDEPENDENT_AMBULATORY_CARE_PROVIDER_SITE_OTHER): Payer: BLUE CROSS/BLUE SHIELD | Admitting: Physician Assistant

## 2017-11-17 VITALS — BP 136/72 | HR 93 | Ht 72.01 in | Wt 249.0 lb

## 2017-11-17 DIAGNOSIS — R0789 Other chest pain: Secondary | ICD-10-CM

## 2017-11-17 NOTE — Progress Notes (Signed)
HPI:                                                                Jean Yu is a 37 y.o. female who presents to [facility]: Primary Care [facility] Medicine today for question chest mass  Patient noticed tender lump on upper chest wall/sternum 1 week ago. Lump felt firm and smooth. Feels like it fluctuates in size. Denies redness, warmth, or skin changes.  No flowsheet data found.    Past Medical History:  Diagnosis Date  . Borderline high cholesterol   . Hypertension   . MS (multiple sclerosis) (HCC) 12/09/2005  . Obesity   . OSA (obstructive sleep apnea)    No past surgical history on file. Social History   Tobacco Use  . Smoking status: Former Games developer  . Smokeless tobacco: Never Used  Substance Use Topics  . Alcohol use: Yes    Alcohol/week: 0.0 oz   family history includes Cancer in her maternal aunt; Heart attack in her mother; Hypertension in her mother.    ROS: negative except as noted in the HPI  Medications: Current Outpatient Medications  Medication Sig Dispense Refill  . cholecalciferol (VITAMIN D) 1000 units tablet Take 1,000 Units by mouth daily.    . Dimethyl Fumarate (TECFIDERA) 240 MG CPDR Take by mouth.    . metoprolol succinate (TOPROL-XL) 50 MG 24 hr tablet Take 1 tablet (50 mg total) by mouth daily. Take with or immediately following a meal. 90 tablet 1  . triamterene-hydrochlorothiazide (DYAZIDE) 37.5-25 MG capsule Take 1 each (1 capsule total) by mouth daily. 90 capsule 1   No current facility-administered medications for this visit.    No Known Allergies     Objective:  BP 136/72   Pulse 93   Ht 6' 0.01" (1.829 m)   Wt 249 lb (112.9 kg)   SpO2 98%   BMI 33.76 kg/m  Gen:  alert, not ill-appearing, no distress, appropriate for age, obese female HEENT: head normocephalic without obvious abnormality, conjunctiva and cornea clear, trachea midline Pulm: Normal work of breathing, normal phonation Neuro: alert and  oriented x 3, no tremor MSK: chest wall without deformity, there is tenderness over the upper third of the sternum, no palpable mass or abnormality, extremities atraumatic, normal gait and station Skin: intact, no rashes on exposed skin, no jaundice, no cyanosis Psych: well-groomed, cooperative, good eye contact, euthymic mood, affect mood-congruent, speech is articulate, and thought processes clear and goal-directed    No results found for this or any previous visit (from the past 72 hour(s)). No results found.    Assessment and Plan: 37 y.o. female with   Acute chest wall pain - no mass or palpable abnormality on exam. I did palpate the sternal angle, there was reproducible tenderness and patient confirmed this is what she was feeling. Reassured that this is her sternum.  - recommend conservative treatment as costochondritis with NSAIDs/ice/heat   Patient education and anticipatory guidance given Patient agrees with treatment plan Follow-up as needed if symptoms worsen or fail to improve  Levonne Hubert PA-C

## 2017-11-17 NOTE — Patient Instructions (Addendum)
Costochondritis Costochondritis is swelling and irritation (inflammation) of the tissue (cartilage) that connects your ribs to your breastbone (sternum). This causes pain in the front of your chest. The pain usually starts gradually and involves more than one rib. What are the causes? The exact cause of this condition is not always known. It results from stress on the cartilage where your ribs attach to your sternum. The cause of this stress could be:  Chest injury (trauma).  Exercise or activity, such as lifting.  Severe coughing.  What increases the risk? You may be at higher risk for this condition if you:  Are female.  Are 30?37 years old.  Recently started a new exercise or work activity.  Have low levels of vitamin D.  Have a condition that makes you cough frequently.  What are the signs or symptoms? The main symptom of this condition is chest pain. The pain:  Usually starts gradually and can be sharp or dull.  Gets worse with deep breathing, coughing, or exercise.  Gets better with rest.  May be worse when you press on the sternum-rib connection (tenderness).  How is this diagnosed? This condition is diagnosed based on your symptoms, medical history, and a physical exam. Your health care provider will check for tenderness when pressing on your sternum. This is the most important finding. You may also have tests to rule out other causes of chest pain. These may include:  A chest X-ray to check for lung problems.  An electrocardiogram (ECG) to see if you have a heart problem that could be causing the pain.  An imaging scan to rule out a chest or rib fracture.  How is this treated? This condition usually goes away on its own over time. Your health care provider may prescribe an NSAID to reduce pain and inflammation. Your health care provider may also suggest that you:  Rest and avoid activities that make pain worse.  Apply heat or cold to the area to reduce pain  and inflammation.  Do exercises to stretch your chest muscles.  If these treatments do not help, your health care provider may inject a numbing medicine at the sternum-rib connection to help relieve the pain. Follow these instructions at home:  Avoid activities that make pain worse. This includes any activities that use chest, abdominal, and side muscles.  If directed, put ice on the painful area: ? Put ice in a plastic bag. ? Place a towel between your skin and the bag. ? Leave the ice on for 20 minutes, 2-3 times a day.  If directed, apply heat to the affected area as often as told by your health care provider. Use the heat source that your health care provider recommends, such as a moist heat pack or a heating pad. ? Place a towel between your skin and the heat source. ? Leave the heat on for 20-30 minutes. ? Remove the heat if your skin turns bright red. This is especially important if you are unable to feel pain, heat, or cold. You may have a greater risk of getting burned.  Take over-the-counter and prescription medicines only as told by your health care provider.  Return to your normal activities as told by your health care provider. Ask your health care provider what activities are safe for you.  Keep all follow-up visits as told by your health care provider. This is important. Contact a health care provider if:  You have chills or a fever.  Your pain does not go   away or it gets worse.  You have a cough that does not go away (is persistent). Get help right away if:  You have shortness of breath. This information is not intended to replace advice given to you by your health care provider. Make sure you discuss any questions you have with your health care provider. Document Released: 03/24/2005 Document Revised: 01/02/2016 Document Reviewed: 10/08/2015 Elsevier Interactive Patient Education  2018 [employer]   

## 2018-01-19 DIAGNOSIS — G35 Multiple sclerosis: Secondary | ICD-10-CM | POA: Diagnosis not present

## 2018-01-25 ENCOUNTER — Ambulatory Visit (INDEPENDENT_AMBULATORY_CARE_PROVIDER_SITE_OTHER): Payer: BLUE CROSS/BLUE SHIELD | Admitting: Family Medicine

## 2018-01-25 ENCOUNTER — Ambulatory Visit (INDEPENDENT_AMBULATORY_CARE_PROVIDER_SITE_OTHER): Payer: BLUE CROSS/BLUE SHIELD

## 2018-01-25 ENCOUNTER — Encounter: Payer: Self-pay | Admitting: Family Medicine

## 2018-01-25 VITALS — BP 141/96 | HR 74 | Ht 72.0 in | Wt 253.0 lb

## 2018-01-25 DIAGNOSIS — G35 Multiple sclerosis: Secondary | ICD-10-CM

## 2018-01-25 DIAGNOSIS — R0602 Shortness of breath: Secondary | ICD-10-CM

## 2018-01-25 DIAGNOSIS — M546 Pain in thoracic spine: Secondary | ICD-10-CM | POA: Diagnosis not present

## 2018-01-25 DIAGNOSIS — M7989 Other specified soft tissue disorders: Secondary | ICD-10-CM | POA: Diagnosis not present

## 2018-01-25 DIAGNOSIS — M7062 Trochanteric bursitis, left hip: Secondary | ICD-10-CM | POA: Diagnosis not present

## 2018-01-25 DIAGNOSIS — M545 Low back pain: Secondary | ICD-10-CM

## 2018-01-25 DIAGNOSIS — G35D Multiple sclerosis, unspecified: Secondary | ICD-10-CM

## 2018-01-25 DIAGNOSIS — R079 Chest pain, unspecified: Secondary | ICD-10-CM

## 2018-01-25 MED ORDER — PREDNISONE 50 MG PO TABS: 50.0000 mg | ORAL_TABLET | Freq: Every day | ORAL | 0 refills | Status: DC

## 2018-01-25 NOTE — Progress Notes (Signed)
Subjective:    I'm seeing this patient as a consultation for:  Jean Longs, PA-C   CC: Back and leg pain  HPI: Jean Yu is a relevant medical history significant for multiple sclerosis.  She has this typically causes fatigue and typically does not cause extremity weakness or numbness.  She is been having pain in her left lateral hip and left leg present for a few weeks.  She notes pain is located lateral hip and radiates down to the lateral thigh mostly and occasionally down to the lateral calf on her left side.  She does have some pain on her right lateral hip as well.  She notes the pain is worse when she lays on her left side stand from a seated position and walking.  She denies any numbness or tingling or significant weakness distally.  She does also note a 1 or 2-day onset of pain in her right low back extending up to her right thoracic back.  She does note some pain extending into her chest but denies crushing substernal chest pain.  She denies any exertional chest pain or shortness of breath.  She also notes a bit of left leg swelling.  She notes it slightly more swollen than the right leg and she is somewhat concerned for the possibility of blood clot.  She notes that her grandmother had DVTs and she is concerned she may have one as well.  She denies any recent periods of immobility or recent leg injury.  She denies any personal history of DVT or PE.  Past medical history, Surgical history, Family history not pertinant except as noted below, Social history, Allergies, and medications have been entered into the medical record, reviewed, and no changes needed.   Review of Systems: No headache, visual changes, nausea, vomiting, diarrhea, constipation, dizziness, abdominal pain, skin rash, fevers, chills, night sweats, weight loss, swollen lymph nodes, body aches, joint swelling, muscle aches, chest pain, shortness of breath, mood changes, visual or auditory hallucinations.   Objective:     Exam:  BP (!) 141/96   Pulse 74   Ht 6' (1.829 m)   Wt 253 lb (114.8 kg)   LMP 01/11/2018   BMI 34.31 kg/m  Gen: Well NAD HEENT: EOMI,  MMM Lungs: CTABL Nl WOB Heart: RRR no MRG Abd: NABS, NT, ND Exts: Non edematous BL  LE, warm and well perfused.  Calf diameters are equivalent to visual inspection.  Palpable cords.  Negative calf squeeze test.  MSK: T and L-spine nontender to midline. Tender palpation right thoracic and lumbar paraspinal muscle group. Lumbar motion intact but pain with flexion and extension. Lower extremity strength reflexes and sensation are intact except were discussed below. Negative slump test bilaterally.  Left hip: Normal-appearing normal motion.  Tender palpation greater trochanter. Hip abduction strength diminished 4/5.  Right hip: Normal-appearing normal motion mildly tender to palpation greater trochanter.  Hip abduction strength diminished 4+/5.  Knees bilaterally normal-appearing nontender normal motion.  Normal gait.   Lab and Radiology Results X-ray images personally independently reviewed.  Awaiting formal radiology review.  2 view chest x-ray: No acute infiltrate or acute changes.  L-spine:: No acute changes.  Loss of normal lumbar lordosis indicating spasm.  Significant bowel gas is seen.  Impression and Recommendations:    Assessment and Plan: 37 y.o. female with  Left lateral hip pain extending to the thigh is very likely trochanteric bursitis.  Patient does have some pain extending beyond the knee on the left leg  to the calf laterally which could be L5 radicular pain.  Plan to proceed with a short course of prednisone as well as referral to physical therapy at home exercise program for trochanteric bursitis and possible lumbar radicular pain.  Will evaluate for left calf swelling and pain with duplex ultrasound to screen for DVT.  I think DVT is pretty low probability however given her family history and her other comorbid medical  problems I think is reasonable.  Patient does have some thoracic back pain and some chest pain.  Her vital signs physical exam and x-rays are all largely normal.  I think her pain is mostly musculoskeletal and will improve with physical therapy as ordered above as well as a short course of prednisone.  However if she acutely worsens or fails to improve or there is more concerned a reasonable next step would be further cardiac work-up including EKG and further work-up for the possibility of PE including CT angiogram however I think she is low risk for both of these.  She does have a history of MS and certainly some of her symptoms could be due to MS however her current symptoms are not consistent with her previous MS symptoms.  Will have a quick follow-up and proceed with further work-up and evaluation.  Recheck in 2 weeks.   Orders Placed This Encounter  Procedures  . DG Lumbar Spine Complete    Standing Status:   Future    Number of Occurrences:   1    Standing Expiration Date:   03/28/2019    Order Specific Question:   Reason for Exam (SYMPTOM  OR DIAGNOSIS REQUIRED)    Answer:   eval low back pain    Order Specific Question:   Is patient pregnant?    Answer:   No    Order Specific Question:   Preferred imaging location?    Answer:   [facility]    Order Specific Question:   Radiology Contrast Protocol - do NOT remove file path    Answer:   [email or web address]  . US Venous Img Lower Unilateral Left    Standing Status:   Future    Standing Expiration Date:   03/28/2019    Order Specific Question:   Reason for Exam (SYMPTOM  OR DIAGNOSIS REQUIRED)    Answer:   eval left leg swelling    Order Specific Question:   Preferred imaging location?    Answer:   [facility]  . DG Chest 2 View    Order Specific Question:   Reason for exam:    Answer:   Cough, assess intra-thoracic pathology    Order Specific Question:   Is the patient  pregnant?    Answer:   No    Order Specific Question:   Preferred imaging location?    Answer:   [facility]  . Ambulatory referral to Physical Therapy    Referral Priority:   Routine    Referral Type:   Physical Medicine    Referral Reason:   Specialty Services Required    Requested Specialty:   Physical Therapy   Meds ordered this encounter  Medications  . predniSONE (DELTASONE) 50 MG tablet    Sig: Take 1 tablet (50 mg total) by mouth daily.    Dispense:  3 tablet    Refill:  0    Discussed warning signs or symptoms. Please see discharge instructions. Patient expresses understanding.

## 2018-01-25 NOTE — Patient Instructions (Signed)
Thank you for coming in today. Get xray low back and chest now.  Schedule leg ultrasound soon.  Start prednisone now.  Attend PT.  Recheck with me in 2-3 weeks.  Let me know sooner if not doing well.

## 2018-01-26 ENCOUNTER — Encounter: Payer: Self-pay | Admitting: Family Medicine

## 2018-01-26 ENCOUNTER — Telehealth: Payer: Self-pay | Admitting: Physician Assistant

## 2018-01-26 NOTE — Telephone Encounter (Signed)
Pt called. She wants to know her x-ray results because she's still in a lot of pain.

## 2018-01-27 NOTE — Telephone Encounter (Signed)
Left VM for Pt to return clinic call regarding results, callback information provided. 

## 2018-01-27 NOTE — Telephone Encounter (Signed)
Thank you :)

## 2018-01-27 NOTE — Telephone Encounter (Signed)
Patient advised of results.

## 2018-01-31 ENCOUNTER — Ambulatory Visit: Payer: BLUE CROSS/BLUE SHIELD

## 2018-01-31 DIAGNOSIS — M546 Pain in thoracic spine: Secondary | ICD-10-CM

## 2018-01-31 DIAGNOSIS — M79662 Pain in left lower leg: Secondary | ICD-10-CM | POA: Diagnosis not present

## 2018-01-31 DIAGNOSIS — G35 Multiple sclerosis: Secondary | ICD-10-CM

## 2018-01-31 DIAGNOSIS — M7989 Other specified soft tissue disorders: Secondary | ICD-10-CM | POA: Diagnosis not present

## 2018-01-31 DIAGNOSIS — M7062 Trochanteric bursitis, left hip: Secondary | ICD-10-CM

## 2018-02-02 ENCOUNTER — Encounter: Payer: Self-pay | Admitting: Rehabilitative and Restorative Service Providers"

## 2018-02-02 ENCOUNTER — Ambulatory Visit: Payer: BLUE CROSS/BLUE SHIELD | Admitting: Rehabilitative and Restorative Service Providers"

## 2018-02-02 DIAGNOSIS — M5416 Radiculopathy, lumbar region: Secondary | ICD-10-CM | POA: Diagnosis not present

## 2018-02-02 DIAGNOSIS — M79605 Pain in left leg: Secondary | ICD-10-CM | POA: Diagnosis not present

## 2018-02-02 DIAGNOSIS — R29898 Other symptoms and signs involving the musculoskeletal system: Secondary | ICD-10-CM

## 2018-02-02 NOTE — Therapy (Signed)
[facility] [place] [address] [place], [place], [address] Phone: [phone]   Fax:  [phone]  Physical Therapy Evaluation  Patient Details  Name: Jean Yu MRN: [id number] Date of Birth: Nov 01, 1980 Referring Provider: Dr Clementeen Graham    Encounter Date: 02/02/2018  PT End of Session - 02/02/18 1629    Visit Number  1    Number of Visits  12    Date for PT Re-Evaluation  03/16/18    PT Start Time  1625    PT Stop Time  1724    PT Time Calculation (min)  59 min    Activity Tolerance  Patient tolerated treatment well       Past Medical History:  Diagnosis Date  . Borderline high cholesterol   . Hypertension   . MS (multiple sclerosis) (HCC) 12/09/2005  . Obesity   . OSA (obstructive sleep apnea)     History reviewed. No pertinent surgical history.  There were no vitals filed for this visit.   Subjective Assessment - 02/02/18 1637    Subjective  Patient reports gradual onset of Lt hip and leg pain with no known injury. She has most pain in the evening and with turning over in bed.     Pertinent History  MS 12 yrs - fatigue, general weakness; Rt sided LBP for a few days resolved with meds; thoracic pain Rt side  - pressure which resolved with meds after MD visit - no other incidents of similar problems; pt remembers episode of Lt sciatica several yrs ago resolved with meds/rest     Diagnostic tests  xrays (-)     Patient Stated Goals  get rid of the leg pain     Currently in Pain?  Yes    Pain Score  7    at night up to 9/10    Pain Location  Leg    Pain Orientation  Left    Pain Descriptors / Indicators  Aching;Burning;Throbbing    Pain Type  Acute pain    Pain Radiating Towards  posterior calf primarily  - in anterior thigh to posterior calf -     Pain Onset  1 to 4 weeks ago    Pain Frequency  Constant    Aggravating Factors   sitting 15-20 then standing; lying down at night;     Pain Relieving Factors   moving; lyingon back with Lt hip and knee bent          [facility] PT Assessment - 02/02/18 0001      Assessment   Medical Diagnosis  Lumbar radiculopathy; Lt LE pain     Referring Provider  Dr Clementeen Graham     Onset Date/Surgical Date  12/26/17    Hand Dominance  Left    Next MD Visit  PRN     Prior Therapy  yrs ago for Rt ankle       Precautions   Precautions  None      Balance Screen   Has the patient fallen in the past 6 months  No    Has the patient had a decrease in activity level because of a fear of falling?   No    Is the patient reluctant to leave their home because of a fear of falling?   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; standing; walking ~ 13 yrs     Leisure  household chores; 45 yr old daughter; [employer] 2x/wk cardio machines 1.5 hrs inc water massage       Observation/Other Assessments   Focus on Therapeutic Outcomes (FOTO)   56% limitation       Sensation   Additional Comments  intermittent tingling bottom of Lt foot       Posture/Postural Control   Posture Comments  head forward; increased thoracic kyphosis; increasd lumbar lordosis      AROM   Right/Left Hip  --   tight end range Lt hip    Lumbar Flexion  80% pain Lt LE    Lumbar Extension  60% pain Lt LE less than flexion    Lumbar - Right Side Bend  80%    Lumbar - Left Side Bend  75%    Lumbar - Right Rotation  50% discomfort    Lumbar - Left Rotation  45% discomfort       Strength   Overall Strength Comments  5/5 bilat LE's       Flexibility   Hamstrings  tight Lt 40deg; Rt 55 deg     Quadriceps  WFL's     ITB  tight bilat     Piriformis  tight Lt > Rt       Palpation   Spinal mobility  hypomobile lumbar spine with CPA and Lt lateral mobs     Palpation comment  muscular tightness Lt lumbar and posterior hip; Lt psoas musculature        Special Tests   Other special tests  Lt straight leg rais (+) with pain inot the Lt calf                Objective measurements completed on examination: See above findings.      [facility] Adult PT Treatment/Exercise - 02/02/18 0001      Self-Care   Self-Care  --   initiated back care education; positions for neutral spine      Lumbar Exercises: Stretches   Passive Hamstring Stretch  Left;3 reps;30 seconds   supine with strap - neural mobilization    Prone on Elbows Stretch  1 rep   2-3 min (PT explaining back pathology)   Press Ups  10 reps   1-2 sec hold    Piriformis Stretch  Left;2 reps;30 seconds   some Lt groin pain/tightness    Other Lumbar Stretch Exercise  standing trunk extension 1 rep x 2-3 sec       Lumbar Exercises: Supine   Other Supine Lumbar Exercises  3 part core 10 sec x 10 reps       Moist Heat Therapy   Number Minutes Moist Heat  15 Minutes    Moist Heat Location  Lumbar Spine;Hip;Knee   Lt      Electrical Stimulation   Electrical Stimulation Location  Lt lumbar; posterior hip; knee     Electrical Stimulation Action  premod    Electrical Stimulation Parameters  to tolerance    Electrical Stimulation Goals  Pain;Tone             PT Education - 02/02/18 1724    Education Details  HEP TENs back care     Person(s) Educated  Patient    Methods  Explanation;Demonstration;Tactile cues;Verbal cues;Handout    Comprehension  Verbalized understanding;Returned demonstration;Verbal cues required;Tactile cues required          PT  Long Term Goals - 02/02/18 1736      PT LONG TERM GOAL #1   Title  Decrease Lt LE pain by 50-75% allowing patient to participate in normal functional activities and sleep without awakening due to pain 03/16/18    Time  6    Period  Weeks    Status  New      PT LONG TERM GOAL #2   Title  Improve core stability allowing patient to perfrom stabilization program 20-30 min with no pain 03/16/18    Time  6    Period   Weeks    Status  New      PT LONG TERM GOAL #3   Title  Patient to demonstrate and/or verbalize proper back care with transfers; transitional movement; sitting; etc  03/16/18    Time  6    Period  Weeks    Status  New      PT LONG TERM GOAL #4   Title  Independent in HEP 03/16/18    Time  6    Period  Weeks    Status  New      PT LONG TERM GOAL #5   Title  Improve FOTO to </= 35% limitation 03/16/18    Time  6    Period  Weeks    Status  New             Plan - 02/02/18 1732    Clinical Impression Statement  Patient presents with ~ 1 month history of Lt LE pain with symptoms consistent with lumbar radiculopathy. She has constant pain in the Lt LE - anterior thigh to posterior calf; limited trunk and Lt LE mobility and ROM; (+) Lt SLR with pain into the Lt calf; pain and tightness in the Lt lumbar spine to Lt posterior hip; limited functional activitiy level and difficulty sleeping; pain on a constant basis. Patient will benefit from PT to address problems identified     History and Personal Factors relevant to plan of care:  episode of Lt sciatica a few yrs ago    Clinical Presentation  Stable    Clinical Presentation due to:  sitting at job for most of the day     Clinical Decision Making  Low    Rehab Potential  Good    PT Frequency  2x / week    PT Duration  6 weeks    PT Treatment/Interventions  Patient/family education;ADLs/Self Care Home Management;Cryotherapy;Electrical Stimulation;Iontophoresis 4mg /ml Dexamethasone;Moist Heat;Traction;Ultrasound;Dry needling;Manual techniques;Neuromuscular re-education;Therapeutic activities;Therapeutic exercise    PT Next Visit Plan  review HEP; progress with core stabilization; extension program; avoid trunk rotation/flexion; manual work and modalities as indicated     Financial planner with Plan of Care  Patient       Patient will benefit from skilled therapeutic intervention in order to improve the following deficits and  impairments:  Postural dysfunction, Improper body mechanics, Pain, Increased fascial restricitons, Increased muscle spasms, Hypomobility, Decreased mobility, Decreased range of motion, Decreased activity tolerance  Visit Diagnosis: Pain in left leg - Plan: PT plan of care cert/re-cert  Radiculopathy, lumbar region - Plan: PT plan of care cert/re-cert  Other symptoms and signs involving the musculoskeletal system - Plan: PT plan of care cert/re-cert     Problem List Patient Active Problem List   Diagnosis Date Noted  . Large breasts 08/29/2017  . Class 1 obesity due to excess calories with serious comorbidity and body mass index (BMI) of 34.0 to 34.9 in  adult 08/29/2017  . Obstructive sleep apnea 05/30/2017  . Family history of sudden cardiac death in mother 2017-05-30  . Chest pain with low risk of acute coronary syndrome 05/30/17  . BMI 33.0-33.9,adult 03/29/2017  . Hypertension 12/18/2008  . MS (multiple sclerosis) (HCC) 12/09/2005    Vuk Skillern Rober Minion PT, MPH  02/02/2018, 5:43 PM  [facility] [address] [place], [place], [address] Phone: [phone]   Fax:  [phone]  Name: Jean Yu MRN: [id number] Date of Birth: 1980/11/20

## 2018-02-02 NOTE — Patient Instructions (Signed)
Abdominal Bracing With Pelvic Floor (Hook-Lying)    With neutral spine, tighten pelvic floor and abdominals sucking belly button to back bone; tighten muscles in the low back at waist. Hold 10 sec  Repeat _10__ times. Do _several __ times a day. Progress to do this in sitting, standing, walking and with functional activities   Trunk: Prone Extension (Press-Ups)    Lie on stomach on firm, flat surface. Relax bottom and legs. Raise chest in air with elbows straight. Keep hips flat on surface, sag stomach. Hold _1-2___ seconds. Repeat _10___ times. Do _several ___ sessions per day. CAUTION: Movement should be gentle and slow.  Trunk Extension    Standing, place back of open hands on low back. Straighten spine then arch the back and move shoulders back. Repeat __1-2__ times per session. Do __several__ sessions per day   HIP: Hamstrings - Supine  Place strap around foot. Raise leg up, keeping knee straight.  Bend opposite knee to protect back if indicated. Hold 30 seconds. 3 reps per set, 2-3 sets per day   Piriformis Stretch   Lying on back, pull right knee toward opposite shoulder. Hold 30 seconds. Repeat 3 times. Do 2-3 sessions per day.   TENS UNIT: This is helpful for muscle pain and spasm.   Search and Purchase a TENS 7000 2nd edition at www.tenspros.com. It should be less than $30.     TENS unit instructions: Do not shower or bathe with the unit on Turn the unit off before removing electrodes or batteries If the electrodes lose stickiness add a drop of water to the electrodes after they are disconnected from the unit and place on plastic sheet. If you continued to have difficulty, call the TENS unit company to purchase more electrodes. Do not apply lotion on the skin area prior to use. Make sure the skin is clean and dry as this will help prolong the life of the electrodes. After use, always check skin for unusual red areas, rash or other skin difficulties. If there  are any skin problems, does not apply electrodes to the same area. Never remove the electrodes from the unit by pulling the wires. Do not use the TENS unit or electrodes other than as directed. Do not change electrode placement without consultating your therapist or physician. Keep 2 fingers with between each electrode.   Sleeping on Back  Place pillow under knees. A pillow with cervical support and a roll around waist are also helpful. Copyright  VHI. All rights reserved.  Sleeping on Side Place pillow between knees. Use cervical support under neck and a roll around waist as needed. Copyright  VHI. All rights reserved.   Sleeping on Stomach   If this is the only desirable sleeping position, place pillow under lower legs, and under stomach or chest as needed.  Posture - Sitting   Sit upright, head facing forward. Try using a roll to support lower back. Keep shoulders relaxed, and avoid rounded back. Keep hips level with knees. Avoid crossing legs for long periods. Stand to Sit / Sit to Stand   To sit: Bend knees to lower self onto front edge of chair, then scoot back on seat. To stand: Reverse sequence by placing one foot forward, and scoot to front of seat. Use rocking motion to stand up.   Work Height and Reach  Ideal work height is no more than 2 to 4 inches below elbow level when standing, and at elbow level when sitting. Reaching should be limited  to arm's length, with elbows slightly bent.  Bending  Bend at hips and knees, not back. Keep feet shoulder-width apart.    Posture - Standing   Good posture is important. Avoid slouching and forward head thrust. Maintain curve in low back and align ears over shoul- ders, hips over ankles.  Alternating Positions   Alternate tasks and change positions frequently to reduce fatigue and muscle tension. Take rest breaks. Computer Work   Position work to Art gallery manager. Use proper work and seat height. Keep shoulders back and  down, wrists straight, and elbows at right angles. Use chair that provides full back support. Add footrest and lumbar roll as needed.  Getting Into / Out of Car  Lower self onto seat, scoot back, then bring in one leg at a time. Reverse sequence to get out.  Dressing  Lie on back to pull socks or slacks over feet, or sit and bend leg while keeping back straight.    Housework - Sink  Place one foot on ledge of cabinet under sink when standing at sink for prolonged periods.   Pushing / Pulling  Pushing is preferable to pulling. Keep back in proper alignment, and use leg muscles to do the work.  Deep Squat   Squat and lift with both arms held against upper trunk. Tighten stomach muscles without holding breath. Use smooth movements to avoid jerking.  Avoid Twisting   Avoid twisting or bending back. Pivot around using foot movements, and bend at knees if needed when reaching for articles.  Carrying Luggage   Distribute weight evenly on both sides. Use a cart whenever possible. Do not twist trunk. Move body as a unit.   Lifting Principles .Maintain proper posture and head alignment. .Slide object as close as possible before lifting. .Move obstacles out of the way. .Test before lifting; ask for help if too heavy. .Tighten stomach muscles without holding breath. .Use smooth movements; do not jerk. .Use legs to do the work, and pivot with feet. .Distribute the work load symmetrically and close to the center of trunk. .Push instead of pull whenever possible.   Ask For Help   Ask for help and delegate to others when possible. Coordinate your movements when lifting together, and maintain the low back curve.  Log Roll   Lying on back, bend left knee and place left arm across chest. Roll all in one movement to the right. Reverse to roll to the left. Always move as one unit. Housework - Sweeping  Use long-handled equipment to avoid stooping.   Housework -  Wiping  Position yourself as close as possible to reach work surface. Avoid straining your back.  Laundry - Unloading Wash   To unload small items at bottom of washer, lift leg opposite to arm being used to reach.  Gardening - Raking  Move close to area to be raked. Use arm movements to do the work. Keep back straight and avoid twisting.     Cart  When reaching into cart with one arm, lift opposite leg to keep back straight.   Getting Into / Out of Bed  Lower self to lie down on one side by raising legs and lowering head at the same time. Use arms to assist moving without twisting. Bend both knees to roll onto back if desired. To sit up, start from lying on side, and use same move-ments in reverse. Housework - Vacuuming  Hold the vacuum with arm held at side. Step back and forth to move  it, keeping head up. Avoid twisting.   Laundry - Armed forces training and education officer so that bending and twisting can be avoided.   Laundry - Unloading Dryer  Squat down to reach into clothes dryer or use a reacher.  Gardening - Weeding / Psychiatric nurse or Kneel. Knee pads may be helpful.

## 2018-02-09 ENCOUNTER — Ambulatory Visit: Payer: BLUE CROSS/BLUE SHIELD | Admitting: Rehabilitative and Restorative Service Providers"

## 2018-02-09 ENCOUNTER — Encounter: Payer: Self-pay | Admitting: Rehabilitative and Restorative Service Providers"

## 2018-02-09 DIAGNOSIS — M5416 Radiculopathy, lumbar region: Secondary | ICD-10-CM | POA: Diagnosis not present

## 2018-02-09 DIAGNOSIS — M79605 Pain in left leg: Secondary | ICD-10-CM

## 2018-02-09 DIAGNOSIS — R29898 Other symptoms and signs involving the musculoskeletal system: Secondary | ICD-10-CM

## 2018-02-09 NOTE — Therapy (Addendum)
[place], [place], [address] Phone: [phone]   Fax:  [phone]  Physical Therapy Treatment  Patient Details  Name: Jean Yu MRN: [id number] Date of Birth: January 17, 1981 Referring Provider: Dr Lynne Leader    Encounter Date: 02/09/2018  PT End of Session - 02/09/18 1656    Visit Number  2    Number of Visits  12    Date for PT Re-Evaluation  03/16/18    PT Start Time  1656    PT Stop Time  1750    PT Time Calculation (min)  54 min    Activity Tolerance  Patient tolerated treatment well       Past Medical History:  Diagnosis Date  . Borderline high cholesterol   . Hypertension   . MS (multiple sclerosis) ([place]) 12/09/2005  . Obesity   . OSA (obstructive sleep apnea)     History reviewed. No pertinent surgical history.  There were no vitals filed for this visit.  Subjective Assessment - 02/09/18 1658    Subjective  Felt better for ~24 hours after the first treatment. She likes the prone press up exercise the best. Loved the TENS unit and plans to order one when she has money to do so.     Currently in Pain?  Yes    Pain Score  7     Pain Location  Leg    Pain Orientation  Left    Pain Descriptors / Indicators  Aching;Burning;Throbbing    Pain Type  Acute pain    Pain Onset  1 to 4 weeks ago    Pain Frequency  Constant         [facility] PT Assessment - 02/09/18 0001      Assessment   Medical Diagnosis  Lumbar radiculopathy; Lt LE pain     Referring Provider  Dr Lynne Leader     Onset Date/Surgical Date  12/26/17    Hand Dominance  Left    Next MD Visit  PRN     Prior Therapy  yrs ago for Rt ankle       AROM   Lumbar Extension  60% no pain       Flexibility   Hamstrings  tight Lt 55 deg; Rt 65 deg                    [facility] Adult PT Treatment/Exercise - 02/09/18 0001      Self-Care   Self-Care  --   continue back education - may try lumbar support for sleep      Lumbar Exercises: Stretches   Passive Hamstring Stretch  Left;3 reps;30 seconds   supine with strap - neural mobilization    Prone on Elbows Stretch  1 rep   2-3 min (PT explaining back pathology)   Press Ups  10 reps   1-2 sec hold - sag belly    Press Ups Limitations  repeated prone press ups after supine exercises     Other Lumbar Stretch Exercise  standing trunk extension 1 rep x 2-3 sec       Lumbar Exercises: Supine   Clam  10 reps   holding one LE still moving opposite - core engaged    Bent Knee Raise  --   painful   Dead Bug  --   painful Lt LE flexion    Bridge  5 reps;4 seconds    Basic Lumbar Stabilization Limitations  bilat UE  flexion alt arms with core engaged - UE's at 90 deg to overhead     Other Supine Lumbar Exercises  3 part core 10 sec x 10 reps       Lumbar Exercises: Quadruped   [place]/Old Horse  10 reps   focus on sag to increase lumbar extension      Moist Heat Therapy   Number Minutes Moist Heat  20 Minutes    Moist Heat Location  Lumbar Spine;Hip;Knee   Lt      Electrical Stimulation   Electrical Stimulation Location  Lt/Rt lumbar; posterior Lt hip    Electrical Stimulation Action  IFC    Electrical Stimulation Parameters  to tolerance    Electrical Stimulation Goals  Pain;Tone             PT Education - 02/09/18 1720    Education Details  HEP     Person(s) Educated  Patient    Methods  Explanation;Demonstration;Tactile cues;Verbal cues;Handout    Comprehension  Verbalized understanding;Returned demonstration;Verbal cues required;Tactile cues required          PT Long Term Goals - 02/09/18 1657      PT LONG TERM GOAL #1   Title  Decrease Lt LE pain by 50-75% allowing patient to participate in normal functional activities and sleep without awakening due to pain 03/16/18    Time  6    Period  Weeks    Status  On-going      PT LONG TERM GOAL #2   Title  Improve core stability allowing patient to perfrom stabilization program  20-30 min with no pain 03/16/18    Time  6    Period  Weeks    Status  On-going      PT LONG TERM GOAL #3   Title  Patient to demonstrate and/or verbalize proper back care with transfers; transitional movement; sitting; etc  03/16/18    Time  6    Period  Weeks    Status  On-going      PT LONG TERM GOAL #4   Title  Independent in HEP 03/16/18    Time  6    Period  Weeks    Status  On-going      PT LONG TERM GOAL #5   Title  Improve FOTO to </= 35% limitation 03/16/18    Time  6    Period  Weeks    Status  On-going            Plan - 02/09/18 1735    Clinical Impression Statement  Good response to initial treatment with ~ 24 hours relief of Lt LE pain. Patient has made some modifications at work and home but continues to have increased pain in the [place] at night when she lies down to rest/sleep. Pt is moving better with improved gait pattern and transitional movement patterns. she has increased ROM with decreased LE pain with SLR. Patient tolerated some core stabilization exercises and was encouraged to work on her exercises at home. She was also encouraged to purchase a TENS unit for home since she has such a good response to estim in the clinic. Pain is now intermittent in nature instead of constant. Patient is progressing gradually toward goasl of therapy. Needs to exercise consistently.     Rehab Potential  Good    PT Frequency  2x / week    PT Duration  6 weeks    PT Treatment/Interventions  Patient/family education;ADLs/Self Care Home  Management;Cryotherapy;Electrical Stimulation;Iontophoresis 78m/ml Dexamethasone;Moist Heat;Traction;Ultrasound;Dry needling;Manual techniques;Neuromuscular re-education;Therapeutic activities;Therapeutic exercise    PT Next Visit Plan  review HEP; progress with core stabilization with trial of prone exercises; extension program; avoid trunk rotation/flexion; manual work and modalities as indicated - pt may try sleeping with lumbar support on a  temporary basis.      Consulted and Agree with Plan of Care  Patient       Patient will benefit from skilled therapeutic intervention in order to improve the following deficits and impairments:  Postural dysfunction, Improper body mechanics, Pain, Increased fascial restricitons, Increased muscle spasms, Hypomobility, Decreased mobility, Decreased range of motion, Decreased activity tolerance  Visit Diagnosis: Pain in left leg  Radiculopathy, lumbar region  Other symptoms and signs involving the musculoskeletal system     Problem List Patient Active Problem List   Diagnosis Date Noted  . Large breasts 08/29/2017  . Class 1 obesity due to excess calories with serious comorbidity and body mass index (BMI) of 34.0 to 34.9 in adult 08/29/2017  . Obstructive sleep apnea 12018/12/06 . Family history of sudden cardiac death in mother 112/11/2016 . Chest pain with low risk of acute coronary syndrome 106-Dec-2018 . BMI 33.0-33.9,adult 03/29/2017  . Hypertension 12/18/2008  . MS (multiple sclerosis) ([place] 12/09/2005    Jean Yu Pt, MPH  02/09/2018, 5:42 PM  [facility] [address]Phone: [phone]  Fax:  [phone] Name: Jean GebertMRN: [id number] of Birth: 608-23-1982 PHYSICAL THERAPY DISCHARGE SUMMARY  Visits from Start of Care: 2  Current functional level related to goals / functional outcomes: See progress note for discharge status    Remaining deficits: Unknown    Education / Equipment: HEP  Plan: Patient agrees to discharge.  Patient goals were partially met. Patient is being discharged due to not returning since the last visit.  ?????     Jean Hurtubise P. HHelene KelpPT, MPH 03/09/18 4:26 PM

## 2018-02-09 NOTE — Patient Instructions (Addendum)
Cat / Cow Flow  Focus on the sag letting you r belly drop toward the surface     Inhale, press spine toward ceiling like a Halloween cat. Keeping strength in arms and abdominals, exhale to soften spine through neutral and into cow pose. Open chest and arch back. Initiate movement between cat and cow at tailbone, one vertebrae at a time.  Repeat __10__ times.    Extremity Flexion (Hook-Lying)    Tighten core and slowly lower right arm over head until back begins to arch. Keep trunk rigid. Repeat __10__ times per set. Do ___1-2_ sets per session. Do __1__ sessions per day.    Strengthening: Hip Abductor - no Resistance    Tighten core. No band, hold one knee still and bring the opposite knee out to the side. Repeat __10__ times per set. Do __1-2__ sets per session. Do __1__ sessions per day.   Bridging    Slowly raise buttocks from floor, keeping core tight. Repeat __5__ times per set. Do __1-2__ sets per session. Do _1___ sessions per day.

## 2018-02-16 ENCOUNTER — Encounter: Payer: BLUE CROSS/BLUE SHIELD | Admitting: Physical Therapy

## 2018-02-17 DIAGNOSIS — G35 Multiple sclerosis: Secondary | ICD-10-CM | POA: Diagnosis not present

## 2018-02-17 DIAGNOSIS — M79605 Pain in left leg: Secondary | ICD-10-CM | POA: Diagnosis not present

## 2018-02-22 DIAGNOSIS — G35 Multiple sclerosis: Secondary | ICD-10-CM | POA: Diagnosis not present

## 2018-02-23 ENCOUNTER — Encounter: Payer: BLUE CROSS/BLUE SHIELD | Admitting: Rehabilitative and Restorative Service Providers"

## 2018-04-11 ENCOUNTER — Telehealth: Payer: Self-pay | Admitting: Physician Assistant

## 2018-04-11 NOTE — Telephone Encounter (Signed)
Jean Yu called this morning,stating she has some concerns. She was seen by an eye doctor the other day. They spotted something behind her left eye. She says they didn't go into details. She is wanting to know if she should be seen by Jean Yu? She is hoping that [name]'s nurse will give her a call back.

## 2018-04-11 NOTE — Telephone Encounter (Signed)
Baird Lyons,   Find out where she went to the eye doctor and we can get notes to help her better understand. Likely if eye doctor saw something she is at the best place to be treated. She does have MS and there are some eye changes associated. What is the next step that the eye doctor wanted you to take?

## 2018-04-11 NOTE — Telephone Encounter (Signed)
I called Ms. Jean Yu and she said the eye exam that she had showed some spotting behind her left eye. Her eye doctor was asking her when her last physical was, and she saw that she was due for a physical. Her eye doctor would like for her to  follow-up with them in about 1 month, but she also wanted to follow-up with you as well. Patient would like to have her physical done along with some blood work. I transferred patient and she has set up an appointment to come see you for her annual physical. I did inform patient about her having MS and that there are sometimes eye changes that are associated with MS. Patient feels better after speaking with her, and is set to come in for a physical within the next week or so. No further questions or concerns at this time.

## 2018-04-24 ENCOUNTER — Ambulatory Visit (INDEPENDENT_AMBULATORY_CARE_PROVIDER_SITE_OTHER): Payer: BLUE CROSS/BLUE SHIELD | Admitting: Physician Assistant

## 2018-04-24 ENCOUNTER — Encounter: Payer: Self-pay | Admitting: Physician Assistant

## 2018-04-24 ENCOUNTER — Other Ambulatory Visit (HOSPITAL_COMMUNITY)
Admission: RE | Admit: 2018-04-24 | Discharge: 2018-04-24 | Disposition: A | Discharge status: Home / Self Care | Payer: BLUE CROSS/BLUE SHIELD | Source: Ambulatory Visit | Attending: Family Medicine | Admitting: Family Medicine

## 2018-04-24 VITALS — BP 132/97 | HR 78 | Ht 72.0 in | Wt 251.0 lb

## 2018-04-24 DIAGNOSIS — Z13220 Encounter for screening for lipoid disorders: Secondary | ICD-10-CM

## 2018-04-24 DIAGNOSIS — I10 Essential (primary) hypertension: Secondary | ICD-10-CM

## 2018-04-24 DIAGNOSIS — Z0000 Encounter for general adult medical examination without abnormal findings: Secondary | ICD-10-CM | POA: Diagnosis not present

## 2018-04-24 DIAGNOSIS — Z23 Encounter for immunization: Secondary | ICD-10-CM | POA: Diagnosis not present

## 2018-04-24 DIAGNOSIS — Z131 Encounter for screening for diabetes mellitus: Secondary | ICD-10-CM | POA: Diagnosis not present

## 2018-04-24 MED ORDER — METOPROLOL SUCCINATE ER 50 MG PO TB24: 50.0000 mg | ORAL_TABLET | Freq: Every day | ORAL | 1 refills | Status: DC

## 2018-04-24 MED ORDER — TRIAMTERENE-HCTZ 37.5-25 MG PO CAPS: 1.0000 | ORAL_CAPSULE | Freq: Every day | ORAL | 1 refills | Status: DC

## 2018-04-24 NOTE — Progress Notes (Signed)
Subjective:     Jean Yu is a 37 y.o. female and is here for a comprehensive physical exam. The patient reports problems - she is concerned about her recent eye exam where they "found something in her left eye". she did not get a good explianation of it. she is supposed to follow up in 3 months. .  Social History   Socioeconomic History  . Marital status: Single    Spouse name: Not on file  . Number of children: Not on file  . Years of education: Not on file  . Highest education level: Not on file  Occupational History  . Not on file  Social Needs  . Financial resource strain: Not on file  . Food insecurity:    Worry: Not on file    Inability: Not on file  . Transportation needs:    Medical: Not on file    Non-medical: Not on file  Tobacco Use  . Smoking status: Former Games developer  . Smokeless tobacco: Never Used  Substance and Sexual Activity  . Alcohol use: Yes    Alcohol/week: 0.0 standard drinks  . Drug use: No  . Sexual activity: Not Currently  Lifestyle  . Physical activity:    Days per week: Not on file    Minutes per session: Not on file  . Stress: Not on file  Relationships  . Social connections:    Talks on phone: Not on file    Gets together: Not on file    Attends religious service: Not on file    Active member of club or organization: Not on file    Attends meetings of clubs or organizations: Not on file    Relationship status: Not on file  . Intimate partner violence:    Fear of current or ex partner: Not on file    Emotionally abused: Not on file    Physically abused: Not on file    Forced sexual activity: Not on file  Other Topics Concern  . Not on file  Social History Narrative  . Not on file   Health Maintenance  Topic Date Due  . HIV Screening  12/23/1995  . PAP SMEAR  12/22/2001  . INFLUENZA VACCINE  01/26/2018  . TETANUS/TDAP  10/31/2024    The following portions of the patient's history were reviewed and updated as appropriate:  allergies, current medications, past family history, past medical history, past social history, past surgical history and problem list.  Review of Systems Pertinent items noted in HPI and remainder of comprehensive ROS otherwise negative.   Objective:    BP (!) 132/97   Pulse 78   Ht 6' (1.829 m)   Wt 251 lb (113.9 kg)   BMI 34.04 kg/m  General appearance: alert, cooperative, appears stated age and moderately obese Head: Normocephalic, without obvious abnormality, atraumatic Eyes: conjunctivae/corneas clear. PERRL, EOM's intact. Fundi benign. Ears: normal TM's and external ear canals both ears Nose: Nares normal. Septum midline. Mucosa normal. No drainage or sinus tenderness. Throat: lips, mucosa, and tongue normal; teeth and gums normal Neck: no adenopathy, no carotid bruit, no JVD, supple, symmetrical, trachea midline and thyroid not enlarged, symmetric, no tenderness/mass/nodules Back: symmetric, no curvature. ROM normal. No CVA tenderness. Lungs: clear to auscultation bilaterally Breasts: normal appearance, no masses or tenderness Heart: regular rate and rhythm, S1, S2 normal, no murmur, click, rub or gallop Abdomen: soft, non-tender; bowel sounds normal; no masses,  no organomegaly Pelvic: cervix normal in appearance, external genitalia normal, no adnexal  masses or tenderness, no cervical motion tenderness, uterus normal size, shape, and consistency and vagina normal without discharge Extremities: extremities normal, atraumatic, no cyanosis or edema Pulses: 2+ and symmetric Skin: Skin color, texture, turgor normal. No rashes or lesions Lymph nodes: Cervical, supraclavicular, and axillary nodes normal. Neurologic: Alert and oriented X 3, normal strength and tone. Normal symmetric reflexes. Normal coordination and gait    Assessment:    Healthy female exam.      Plan:    Marland KitchenMarland KitchenKwana was seen today for annual exam.  Diagnoses and all orders for this visit:  Routine physical  examination -     Lipid Panel w/reflex Direct LDL -     COMPLETE METABOLIC PANEL WITH GFR -     TSH -     Cytology - PAP  Essential hypertension -     metoprolol succinate (TOPROL-XL) 50 MG 24 hr tablet; Take 1 tablet (50 mg total) by mouth daily. Take with or immediately following a meal. -     triamterene-hydrochlorothiazide (DYAZIDE) 37.5-25 MG capsule; Take 1 each (1 capsule total) by mouth daily.  Screening for lipid disorders -     Lipid Panel w/reflex Direct LDL  Screening for diabetes mellitus -     COMPLETE METABOLIC PANEL WITH GFR  Need for immunization against influenza -     Flu Vaccine QUAD 36+ mos IM   .[name] Depression screen [facility] 2/9 04/24/2018 08/29/2017 03/29/2017  Decreased Interest 0 0 0  Down, Depressed, Hopeless 0 0 0  PHQ - 2 Score 0 0 0  Altered sleeping 0 0 -  Tired, decreased energy 0 1 -  Change in appetite 0 0 -  Feeling bad or failure about yourself  0 0 -  Trouble concentrating 0 0 -  Moving slowly or fidgety/restless 0 0 -  Suicidal thoughts 0 0 -  PHQ-9 Score 0 1 -  Difficult doing work/chores Not difficult at all Somewhat difficult -   .[name] Discussed 150 minutes of exercise a week.  Encouraged vitamin D 1000 units and Calcium 1300mg  or 4 servings of dairy a day.  Pap done today. Declined STI testing.  Fasting labs ordered.   Unclear what the ocular findings she is concerned about. I faxed for records so I can explain to patient.  See After Visit Summary for Counseling Recommendations

## 2018-04-24 NOTE — Patient Instructions (Addendum)
Will call and get eye exam from my eye doctor [place].  Health Maintenance, Female Adopting a healthy lifestyle and getting preventive care can go a long way to promote health and wellness. Talk with your health care provider about what schedule of regular examinations is right for you. This is a good chance for you to check in with your provider about disease prevention and staying healthy. In between checkups, there are plenty of things you can do on your own. Experts have done a lot of research about which lifestyle changes and preventive measures are most likely to keep you healthy. Ask your health care provider for more information. Weight and diet Eat a healthy diet  Be sure to include plenty of vegetables, fruits, low-fat dairy products, and lean protein.  Do not eat a lot of foods high in solid fats, added sugars, or salt.  Get regular exercise. This is one of the most important things you can do for your health. ? Most adults should exercise for at least 150 minutes each week. The exercise should increase your heart rate and make you sweat (moderate-intensity exercise). ? Most adults should also do strengthening exercises at least twice a week. This is in addition to the moderate-intensity exercise.  Maintain a healthy weight  Body mass index (BMI) is a measurement that can be used to identify possible weight problems. It estimates body fat based on height and weight. Your health care provider can help determine your BMI and help you achieve or maintain a healthy weight.  For females 61 years of age and older: ? A BMI below 18.5 is considered underweight. ? A BMI of 18.5 to 24.9 is normal. ? A BMI of 25 to 29.9 is considered overweight. ? A BMI of 30 and above is considered obese.  Watch levels of cholesterol and blood lipids  You should start having your blood tested for lipids and cholesterol at 37 years of age, then have this test every 5 years.  You may need to have  your cholesterol levels checked more often if: ? Your lipid or cholesterol levels are high. ? You are older than 37 years of age. ? You are at high risk for heart disease.  Cancer screening Lung Cancer  Lung cancer screening is recommended for adults 78-24 years old who are at high risk for lung cancer because of a history of smoking.  A yearly low-dose CT scan of the lungs is recommended for people who: ? Currently smoke. ? Have quit within the past 15 years. ? Have at least a 30-pack-year history of smoking. A pack year is smoking an average of one pack of cigarettes a day for 1 year.  Yearly screening should continue until it has been 15 years since you quit.  Yearly screening should stop if you develop a health problem that would prevent you from having lung cancer treatment.  Breast Cancer  Practice breast self-awareness. This means understanding how your breasts normally appear and feel.  It also means doing regular breast self-exams. Let your health care provider know about any changes, no matter how small.  If you are in your 20s or 30s, you should have a clinical breast exam (CBE) by a health care provider every 1-3 years as part of a regular health exam.  If you are 63 or older, have a CBE every year. Also consider having a breast X-ray (mammogram) every year.  If you have a family history of breast cancer, talk to your health  care provider about genetic screening.  If you are at high risk for breast cancer, talk to your health care provider about having an MRI and a mammogram every year.  Breast cancer gene (BRCA) assessment is recommended for women who have family members with BRCA-related cancers. BRCA-related cancers include: ? Breast. ? Ovarian. ? Tubal. ? Peritoneal cancers.  Results of the assessment will determine the need for genetic counseling and BRCA1 and BRCA2 testing.  Cervical Cancer Your health care provider may recommend that you be screened  regularly for cancer of the pelvic organs (ovaries, uterus, and vagina). This screening involves a pelvic examination, including checking for microscopic changes to the surface of your cervix (Pap test). You may be encouraged to have this screening done every 3 years, beginning at age 41.  For women ages 60-65, health care providers may recommend pelvic exams and Pap testing every 3 years, or they may recommend the Pap and pelvic exam, combined with testing for human papilloma virus (HPV), every 5 years. Some types of HPV increase your risk of cervical cancer. Testing for HPV may also be done on women of any age with unclear Pap test results.  Other health care providers may not recommend any screening for nonpregnant women who are considered low risk for pelvic cancer and who do not have symptoms. Ask your health care provider if a screening pelvic exam is right for you.  If you have had past treatment for cervical cancer or a condition that could lead to cancer, you need Pap tests and screening for cancer for at least 20 years after your treatment. If Pap tests have been discontinued, your risk factors (such as having a new sexual partner) need to be reassessed to determine if screening should resume. Some women have medical problems that increase the chance of getting cervical cancer. In these cases, your health care provider may recommend more frequent screening and Pap tests.  Colorectal Cancer  This type of cancer can be detected and often prevented.  Routine colorectal cancer screening usually begins at 37 years of age and continues through 37 years of age.  Your health care provider may recommend screening at an earlier age if you have risk factors for colon cancer.  Your health care provider may also recommend using home test kits to check for hidden blood in the stool.  A small camera at the end of a tube can be used to examine your colon directly (sigmoidoscopy or colonoscopy). This is  done to check for the earliest forms of colorectal cancer.  Routine screening usually begins at age 28.  Direct examination of the colon should be repeated every 5-10 years through 37 years of age. However, you may need to be screened more often if early forms of precancerous polyps or small growths are found.  Skin Cancer  Check your skin from head to toe regularly.  Tell your health care provider about any new moles or changes in moles, especially if there is a change in a mole's shape or color.  Also tell your health care provider if you have a mole that is larger than the size of a pencil eraser.  Always use sunscreen. Apply sunscreen liberally and repeatedly throughout the day.  Protect yourself by wearing long sleeves, pants, a wide-brimmed hat, and sunglasses whenever you are outside.  Heart disease, diabetes, and high blood pressure  High blood pressure causes heart disease and increases the risk of stroke. High blood pressure is more likely to develop  in: ? People who have blood pressure in the high end of the normal range (130-139/85-89 mm Hg). ? People who are overweight or obese. ? People who are African American.  If you are 9-14 years of age, have your blood pressure checked every 3-5 years. If you are 18 years of age or older, have your blood pressure checked every year. You should have your blood pressure measured twice-once when you are at a hospital or clinic, and once when you are not at a hospital or clinic. Record the average of the two measurements. To check your blood pressure when you are not at a hospital or clinic, you can use: ? An automated blood pressure machine at a pharmacy. ? A home blood pressure monitor.  If you are between 57 years and 59 years old, ask your health care provider if you should take aspirin to prevent strokes.  Have regular diabetes screenings. This involves taking a blood sample to check your fasting blood sugar level. ? If you are  at a normal weight and have a low risk for diabetes, have this test once every three years after 37 years of age. ? If you are overweight and have a high risk for diabetes, consider being tested at a younger age or more often. Preventing infection Hepatitis B  If you have a higher risk for hepatitis B, you should be screened for this virus. You are considered at high risk for hepatitis B if: ? You were born in a country where hepatitis B is common. Ask your health care provider which countries are considered high risk. ? Your parents were born in a high-risk country, and you have not been immunized against hepatitis B (hepatitis B vaccine). ? You have HIV or AIDS. ? You use needles to inject street drugs. ? You live with someone who has hepatitis B. ? You have had sex with someone who has hepatitis B. ? You get hemodialysis treatment. ? You take certain medicines for conditions, including cancer, organ transplantation, and autoimmune conditions.  Hepatitis C  Blood testing is recommended for: ? Everyone born from [birth year] through 1965. ? Anyone with known risk factors for hepatitis C.  Sexually transmitted infections (STIs)  You should be screened for sexually transmitted infections (STIs) including gonorrhea and chlamydia if: ? You are sexually active and are younger than 37 years of age. ? You are older than 37 years of age and your health care provider tells you that you are at risk for this type of infection. ? Your sexual activity has changed since you were last screened and you are at an increased risk for chlamydia or gonorrhea. Ask your health care provider if you are at risk.  If you do not have HIV, but are at risk, it may be recommended that you take a prescription medicine daily to prevent HIV infection. This is called pre-exposure prophylaxis (PrEP). You are considered at risk if: ? You are sexually active and do not regularly use condoms or know the HIV status of your  partner(s). ? You take drugs by injection. ? You are sexually active with a partner who has HIV.  Talk with your health care provider about whether you are at high risk of being infected with HIV. If you choose to begin PrEP, you should first be tested for HIV. You should then be tested every 3 months for as long as you are taking PrEP. Pregnancy  If you are premenopausal and you may become pregnant, ask  your health care provider about preconception counseling.  If you may become pregnant, take 400 to 800 micrograms (mcg) of folic acid every day.  If you want to prevent pregnancy, talk to your health care provider about birth control (contraception). Osteoporosis and menopause  Osteoporosis is a disease in which the bones lose minerals and strength with aging. This can result in serious bone fractures. Your risk for osteoporosis can be identified using a bone density scan.  If you are 13 years of age or older, or if you are at risk for osteoporosis and fractures, ask your health care provider if you should be screened.  Ask your health care provider whether you should take a calcium or vitamin D supplement to lower your risk for osteoporosis.  Menopause may have certain physical symptoms and risks.  Hormone replacement therapy may reduce some of these symptoms and risks. Talk to your health care provider about whether hormone replacement therapy is right for you. Follow these instructions at home:  Schedule regular health, dental, and eye exams.  Stay current with your immunizations.  Do not use any tobacco products including cigarettes, chewing tobacco, or electronic cigarettes.  If you are pregnant, do not drink alcohol.  If you are breastfeeding, limit how much and how often you drink alcohol.  Limit alcohol intake to no more than 1 drink per day for nonpregnant women. One drink equals 12 ounces of beer, 5 ounces of wine, or 1 ounces of hard liquor.  Do not use street  drugs.  Do not share needles.  Ask your health care provider for help if you need support or information about quitting drugs.  Tell your health care provider if you often feel depressed.  Tell your health care provider if you have ever been abused or do not feel safe at home. This information is not intended to replace advice given to you by your health care provider. Make sure you discuss any questions you have with your health care provider. Document Released: 12/28/2010 Document Revised: 11/20/2015 Document Reviewed: 03/18/2015 Elsevier Interactive Patient Education  [employer].

## 2018-04-26 LAB — CYTOLOGY - PAP: Diagnosis: NEGATIVE

## 2018-04-26 NOTE — Progress Notes (Signed)
Call pt: normal. Pap follow up in 3 years.

## 2018-05-06 DIAGNOSIS — Z79899 Other long term (current) drug therapy: Secondary | ICD-10-CM | POA: Diagnosis not present

## 2018-05-06 DIAGNOSIS — N3000 Acute cystitis without hematuria: Secondary | ICD-10-CM | POA: Diagnosis not present

## 2018-05-06 DIAGNOSIS — K5900 Constipation, unspecified: Secondary | ICD-10-CM | POA: Diagnosis not present

## 2018-05-06 DIAGNOSIS — I10 Essential (primary) hypertension: Secondary | ICD-10-CM | POA: Diagnosis not present

## 2018-05-06 DIAGNOSIS — K625 Hemorrhage of anus and rectum: Secondary | ICD-10-CM | POA: Diagnosis not present

## 2018-05-06 DIAGNOSIS — R109 Unspecified abdominal pain: Secondary | ICD-10-CM | POA: Diagnosis not present

## 2018-05-06 DIAGNOSIS — G35 Multiple sclerosis: Secondary | ICD-10-CM | POA: Diagnosis not present

## 2018-05-06 DIAGNOSIS — Z87891 Personal history of nicotine dependence: Secondary | ICD-10-CM | POA: Diagnosis not present

## 2018-05-06 DIAGNOSIS — I499 Cardiac arrhythmia, unspecified: Secondary | ICD-10-CM | POA: Diagnosis not present

## 2018-05-07 MED ORDER — SODIUM CHLORIDE 0.9 % IV SOLN
10.00 | INTRAVENOUS | Status: DC
Start: ? — End: 2018-05-07

## 2018-05-07 MED ORDER — GENERIC EXTERNAL MEDICATION
10.00 | Status: DC
Start: ? — End: 2018-05-07

## 2018-05-08 DIAGNOSIS — Z0000 Encounter for general adult medical examination without abnormal findings: Secondary | ICD-10-CM | POA: Diagnosis not present

## 2018-05-08 DIAGNOSIS — Z13220 Encounter for screening for lipoid disorders: Secondary | ICD-10-CM | POA: Diagnosis not present

## 2018-05-08 DIAGNOSIS — Z131 Encounter for screening for diabetes mellitus: Secondary | ICD-10-CM | POA: Diagnosis not present

## 2018-05-09 LAB — LIPID PANEL W/REFLEX DIRECT LDL
CHOLESTEROL: 207 mg/dL — AB (ref <200)
HDL: 76 mg/dL (ref >50)
LDL Cholesterol (Calc): 117 mg/dL (calc) — ABNORMAL HIGH
Non-HDL Cholesterol (Calc): 131 mg/dL (calc) — ABNORMAL HIGH (ref <130)
TRIGLYCERIDES: 52 mg/dL (ref <150)
Total CHOL/HDL Ratio: 2.7 (calc) (ref <5.0)

## 2018-05-09 LAB — TSH: TSH: 0.58 mIU/L

## 2018-05-09 LAB — COMPLETE METABOLIC PANEL WITH GFR
AG Ratio: 1.5 (calc) (ref 1.0–2.5)
ALBUMIN MSPROF: 4.3 g/dL (ref 3.6–5.1)
ALT: 11 U/L (ref 6–29)
AST: 11 U/L (ref 10–30)
Alkaline phosphatase (APISO): 43 U/L (ref 33–115)
BUN: 14 mg/dL (ref 7–25)
CO2: 28 mmol/L (ref 20–32)
CREATININE: 0.73 mg/dL (ref 0.50–1.10)
Calcium: 9.2 mg/dL (ref 8.6–10.2)
Chloride: 104 mmol/L (ref 98–110)
GFR, EST AFRICAN AMERICAN: 122 mL/min/{1.73_m2} (ref >=60)
GFR, Est Non African American: 105 mL/min/{1.73_m2} (ref >=60)
GLOBULIN: 2.8 g/dL (ref 1.9–3.7)
GLUCOSE: 81 mg/dL (ref 65–99)
Potassium: 4.1 mmol/L (ref 3.5–5.3)
SODIUM: 139 mmol/L (ref 135–146)
TOTAL PROTEIN: 7.1 g/dL (ref 6.1–8.1)
Total Bilirubin: 0.8 mg/dL (ref 0.2–1.2)

## 2018-05-09 NOTE — Progress Notes (Signed)
Call pt: cholesterol stable and good. HDL wonderful. LDL just above optimal level but still very good.  Thyroid normal range.  Kidney, liver, glucose looks good.

## 2018-06-19 DIAGNOSIS — Z79899 Other long term (current) drug therapy: Secondary | ICD-10-CM | POA: Diagnosis not present

## 2018-06-19 DIAGNOSIS — G35 Multiple sclerosis: Secondary | ICD-10-CM | POA: Diagnosis not present

## 2018-07-21 DIAGNOSIS — G35 Multiple sclerosis: Secondary | ICD-10-CM | POA: Diagnosis not present

## 2018-07-21 DIAGNOSIS — Z79899 Other long term (current) drug therapy: Secondary | ICD-10-CM | POA: Diagnosis not present

## 2018-07-21 DIAGNOSIS — G4733 Obstructive sleep apnea (adult) (pediatric): Secondary | ICD-10-CM | POA: Diagnosis not present

## 2018-09-26 ENCOUNTER — Encounter: Payer: Self-pay | Admitting: Physician Assistant

## 2018-09-26 ENCOUNTER — Other Ambulatory Visit: Payer: Self-pay

## 2018-09-26 ENCOUNTER — Ambulatory Visit (INDEPENDENT_AMBULATORY_CARE_PROVIDER_SITE_OTHER): Payer: BLUE CROSS/BLUE SHIELD | Admitting: Physician Assistant

## 2018-09-26 VITALS — BP 121/76 | HR 52 | Temp 98.4°F | Ht 71.5 in | Wt 255.0 lb

## 2018-09-26 DIAGNOSIS — R52 Pain, unspecified: Secondary | ICD-10-CM | POA: Diagnosis not present

## 2018-09-26 DIAGNOSIS — R509 Fever, unspecified: Secondary | ICD-10-CM

## 2018-09-26 NOTE — Progress Notes (Deleted)
Not feeling well, bodyaches, temp 94.0-95.0. denies cough or SOB. Drainage and throat irritated.No headache. Chills. Drinking hot tea and vitamin c and ginger drops. Feeling like has a fever but temp is normal.

## 2018-09-26 NOTE — Progress Notes (Signed)
  Subjective:     Patient ID: Jean Yu, female   DOB: [birth year], 38 y.o.   MRN: [id number]  HPI Pt is a 38 yo female with MS who calls in via webex to discuss new symptoms that started last night of body aches, fatigue, PND. COVID-19 pandemic is here and she is concerned.  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 She denies any known sick contacts. She has not had any travel. She lives with her daughter. Her daughter is asymptomatic. 2 weeks ago had a sinus infection and treated with zpak. Felt much better after treatment. She feels like suddenly tired and achy all over. She feels "hot" but checking her temperature and is 95-98 at home. She feels like her mucus is "thicker". She is urinating a little more. She denies any cough, SOb or chest tightness. Her stomach feels a little "uneasy". She is drinking hot tea with ginger.   .. Active Ambulatory Problems    Diagnosis Date Noted  . Hypertension 12/18/2008  . MS (multiple sclerosis) (HCC) 12/09/2005  . BMI 33.0-33.9,adult 03/29/2017  . Obstructive sleep apnea May 06, 2017  . Family history of sudden cardiac death in mother 05/06/2017  . Chest pain with low risk of acute coronary syndrome 05/06/2017  . Large breasts 08/29/2017  . Class 1 obesity due to excess calories with serious comorbidity and body mass index (BMI) of 34.0 to 34.9 in adult 08/29/2017   Resolved Ambulatory Problems    Diagnosis Date Noted  . PHLEBITIS&THROMBOPHLEB SUP VESSELS LOWER EXTREM 12/18/2008  . CRAMP IN LIMB 12/18/2008  . EDEMA LEG 12/18/2008   Past Medical History:  Diagnosis Date  . Borderline high cholesterol   . Obesity   . OSA (obstructive sleep apnea)      Review of Systems    see HPI.  Objective:   Physical Exam Vitals signs reviewed.  Constitutional:      Appearance: Normal appearance. She is ill-appearing.  HENT:     Head: Normocephalic.  Neck:     Musculoskeletal: Normal range of motion.  Cardiovascular:     Rate and  Rhythm: Normal rate.     Pulses: Normal pulses.  Pulmonary:     Effort: Pulmonary effort is normal.  Neurological:     General: No focal deficit present.     Mental Status: She is alert and oriented to person, place, and time.  Psychiatric:        Mood and Affect: Mood normal.        Assessment:     Marland KitchenMarland KitchenDiagnoses and all orders for this visit:  Generalized body aches  Fever and chills       Plan:     Pt is high risk for COVID-19. 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 Her symptoms are early but would like to treat her as suspected covid guidelines. Letter printed for her work. Discussed symptomatic care with tylenol, hydration, rest. If cough or SOB develops please call office or go to ED. Discussed this also could be another viral infection and even the flu. Follow up as needed.

## 2018-09-28 ENCOUNTER — Telehealth: Payer: Self-pay | Admitting: Neurology

## 2018-09-28 NOTE — Telephone Encounter (Signed)
Patient called and left vm to provide an update on symptoms after her phone appt on Tuesday. She states still no fever or cough. Her stiffness is better. No new symptoms. FYI.

## 2018-09-29 NOTE — Telephone Encounter (Signed)
Tried to call back with no answer and no vm. Will try again later.

## 2018-09-29 NOTE — Telephone Encounter (Signed)
Tried to call again with no answer and no vm.

## 2018-09-29 NOTE — Telephone Encounter (Signed)
Good to hear. Hopefully you will continue to get better over the weekend. Same treatment. With no COVId symptoms ok to go back to work on Monday.

## 2018-10-02 ENCOUNTER — Telehealth: Payer: Self-pay

## 2018-10-02 NOTE — Telephone Encounter (Signed)
Spoke with patient and made her aware that she can return to work. She would like to go back Wednesday. Note written. She will print note off mychart.

## 2018-10-02 NOTE — Telephone Encounter (Signed)
Opened in error

## 2018-12-13 DIAGNOSIS — Z1159 Encounter for screening for other viral diseases: Secondary | ICD-10-CM | POA: Diagnosis not present

## 2018-12-13 DIAGNOSIS — Z03818 Encounter for observation for suspected exposure to other biological agents ruled out: Secondary | ICD-10-CM | POA: Diagnosis not present

## 2019-01-19 ENCOUNTER — Other Ambulatory Visit: Payer: Self-pay | Admitting: Physician Assistant

## 2019-01-19 DIAGNOSIS — I10 Essential (primary) hypertension: Secondary | ICD-10-CM

## 2019-03-21 DIAGNOSIS — Z79899 Other long term (current) drug therapy: Secondary | ICD-10-CM | POA: Diagnosis not present

## 2019-03-21 DIAGNOSIS — R5383 Other fatigue: Secondary | ICD-10-CM | POA: Diagnosis not present

## 2019-03-21 DIAGNOSIS — G4733 Obstructive sleep apnea (adult) (pediatric): Secondary | ICD-10-CM | POA: Diagnosis not present

## 2019-03-21 DIAGNOSIS — G35 Multiple sclerosis: Secondary | ICD-10-CM | POA: Diagnosis not present

## 2019-04-05 ENCOUNTER — Emergency Department
Admission: EM | Admit: 2019-04-05 | Discharge: 2019-04-05 | Disposition: A | Discharge status: Home / Self Care | Payer: BC Managed Care – PPO | Source: Home / Self Care

## 2019-04-05 ENCOUNTER — Telehealth: Payer: Self-pay

## 2019-04-05 ENCOUNTER — Emergency Department (INDEPENDENT_AMBULATORY_CARE_PROVIDER_SITE_OTHER): Payer: BC Managed Care – PPO

## 2019-04-05 ENCOUNTER — Other Ambulatory Visit: Payer: Self-pay

## 2019-04-05 DIAGNOSIS — R0789 Other chest pain: Secondary | ICD-10-CM | POA: Diagnosis not present

## 2019-04-05 DIAGNOSIS — R079 Chest pain, unspecified: Secondary | ICD-10-CM | POA: Diagnosis not present

## 2019-04-05 NOTE — ED Provider Notes (Signed)
[facility]    CSN: [id number] Arrival date & time: 04/05/19  1227      History   Chief Complaint Chief Complaint  Patient presents with  . Shortness of Breath  . Chest Pain    HPI Jean Yu is a 38 y.o. female.   HPI Jean Yu is a 38 y.o. female presenting to UC with c/o intermittent chest discomfort and mild SOB for about 2 days. Chest pain is a sharp "grabbing" ache for a few seconds at a time, which takes her breath away.  She had 2 episodes last night and one today while sitting at her desk at work.  Pt feels like her heart is racing at times and it is hard to catch her breath. Pain does not radiate.  Nothing seems to make her symptoms better or worse but they only last a few seconds.  Hx of similar but more severe pain about 2 years ago. She had a stress test at that time and believes she may have needed to f/u but never did. She was started on a new BP medication briefly but once her medication ran out, she was advised to f/u with her PCP. Her BP remained well controlled with her ongoing BP medication so she was never restarted on the secondary medication.  Denies family hx of CAD. No recent URI symptoms- no cough, congestion, fever, chills, n/v/d.    Past Medical History:  Diagnosis Date  . Borderline high cholesterol   . Hypertension   . MS (multiple sclerosis) (HCC) 12/09/2005  . Obesity   . OSA (obstructive sleep apnea)     Patient Active Problem List   Diagnosis Date Noted  . Large breasts 08/29/2017  . Class 1 obesity due to excess calories with serious comorbidity and body mass index (BMI) of 34.0 to 34.9 in adult 08/29/2017  . Obstructive sleep apnea 2017/05/25  . Family history of sudden cardiac death in mother May 25, 2017  . Chest pain with low risk of acute coronary syndrome 05-25-17  . BMI 33.0-33.9,adult 03/29/2017  . Hypertension 12/18/2008  . MS (multiple sclerosis) (HCC) 12/09/2005    History reviewed. No pertinent  surgical history.  OB History   No obstetric history on file.      Home Medications    Prior to Admission medications   Medication Sig Start Date End Date Taking? Authorizing Provider  cholecalciferol (VITAMIN D) 1000 units tablet Take 1,000 Units by mouth daily.    [provider]  Dimethyl Fumarate (TECFIDERA) 240 MG CPDR Take by mouth.    [provider]  metoprolol succinate (TOPROL-XL) 50 MG 24 hr tablet TAKE 1 TABLET BY MOUTH ONCE DAILY, TAKE WITH OR IMMEDIATELY FOLLOWING A MEAL 01/19/19   Breeback, [name] L, PA-C  triamterene-hydrochlorothiazide (DYAZIDE) 37.5-25 MG capsule Take 1 capsule by mouth once daily 01/19/19   Breeback, [name] L, PA-C  vitamin B-12 (CYANOCOBALAMIN) 1000 MCG tablet Take by mouth.    [provider]    Family History Family History  Problem Relation Age of Onset  . Hypertension Mother   . Heart attack Mother   . Cancer Maternal Aunt     Social History Social History   Tobacco Use  . Smoking status: Former Games developer  . Smokeless tobacco: Never Used  Substance Use Topics  . Alcohol use: Yes    Alcohol/week: 0.0 standard drinks  . Drug use: No     Allergies   Patient has no known allergies.   Review of Systems  Review of Systems  Constitutional: Negative for chills, diaphoresis, fatigue and fever.  HENT: Negative for congestion and sore throat.   Respiratory: Positive for chest tightness and shortness of breath. Negative for cough.   Cardiovascular: Positive for chest pain and palpitations.  Gastrointestinal: Negative for diarrhea, nausea and vomiting.  Neurological: Negative for dizziness, light-headedness and headaches.     Physical Exam Triage Vital Signs ED Triage Vitals [04/05/19 1236]  Enc Vitals Group     BP      Pulse      Resp      Temp      Temp src      SpO2      Weight 256 lb (116.1 kg)     Height 5\' 11"  (1.803 m)     Head Circumference      Peak Flow      Pain Score 0     Pain Loc       Pain Edu?      Excl. in GC?    No data found.  Updated Vital Signs BP (!) 126/93 (BP Location: Left Arm)   Pulse 88   Temp 98.4 F (36.9 C) (Tympanic)   Ht 5\' 11"  (1.803 m)   Wt 256 lb (116.1 kg)   LMP 03/15/2019 (Approximate)   SpO2 99%   BMI 35.70 kg/m   Visual Acuity Right Eye Distance:   Left Eye Distance:   Bilateral Distance:    Right Eye Near:   Left Eye Near:    Bilateral Near:     Physical Exam Vitals signs and nursing note reviewed.  Constitutional:      General: She is not in acute distress.    Appearance: She is well-developed. She is obese. She is not ill-appearing, toxic-appearing or diaphoretic.  HENT:     Head: Normocephalic and atraumatic.  Neck:     Musculoskeletal: Normal range of motion and neck supple.  Cardiovascular:     Rate and Rhythm: Normal rate and regular rhythm.  Pulmonary:     Effort: Pulmonary effort is normal.     Breath sounds: Normal breath sounds. No decreased breath sounds, wheezing, rhonchi or rales.  Musculoskeletal: Normal range of motion.  Skin:    General: Skin is warm and dry.  Neurological:     Mental Status: She is alert and oriented to person, place, and time.  Psychiatric:        Behavior: Behavior normal.      UC Treatments / Results  Labs (all labs ordered are listed, but only abnormal results are displayed) Labs Reviewed - No data to display  EKG Normal sinus rhythm  Nonspecific T wave abnormalities. See scanned report. No change from EKG on 05/04/2017  Radiology Dg Chest 2 View  Result Date: 04/05/2019 CLINICAL DATA:  Chest pain and pressure left-sided 3 days. EXAM: CHEST - 2 VIEW COMPARISON:  01/25/2018 FINDINGS: Lungs are adequately inflated and otherwise clear. Cardiomediastinal silhouette and remainder of the exam is unchanged. IMPRESSION: No active cardiopulmonary disease. Electronically Signed   By: Elberta Fortisaniel  Boyle M.D.   On: 04/05/2019 14:09    Procedures Procedures (including critical care  time)  Medications Ordered in UC Medications - No data to display  Initial Impression / Assessment and Plan / UC Course  I have reviewed the triage vital signs and the nursing notes.  Pertinent labs & imaging results that were available during my care of the patient were reviewed by me and considered in my medical decision  making (see chart for details).     Reassured pt of no changes to EKG Symptoms atypical for ACS Encouraged f/u with PCP, may need referral back to cardiology.  Discussed symptoms that warrant emergent care in the ED. Pt verbalized understanding and agreement with tx plan.  Final Clinical Impressions(s) / UC Diagnoses   Final diagnoses:  Atypical chest pain     Discharge Instructions      You may take 500mg  acetaminophen every 4-6 hours or in combination with ibuprofen 400-600mg  every 6-8 hours as needed for pain and inflammation.    Please follow up with your family doctor next week for further evaluation and treatment of recurrent chest pain.  She may want to refer you back to a cardiologist.  Call 911 or go to the closest hospital for further evaluation and treatment if pain is worsening, more persistent, difficulty breathing, pain associated with sweating, nausea or vomiting, or other new concerning symptoms develop.     ED Prescriptions    None     PDMP not reviewed this encounter.   Noe Gens, [place] 04/05/19 1610

## 2019-04-05 NOTE — ED Triage Notes (Signed)
Pt c/o chest discomfort and shortness of breath x 2 days. Intermittent episodes. 2 the last two nights and 1 today while at work. Describes as heart racing and hard to catch her breath. 137/89 BP today.

## 2019-04-05 NOTE — Discharge Instructions (Signed)
°  You may take 500mg  acetaminophen every 4-6 hours or in combination with ibuprofen 400-600mg  every 6-8 hours as needed for pain and inflammation.    Please follow up with your family doctor next week for further evaluation and treatment of recurrent chest pain.  She may want to refer you back to a cardiologist.  Call 911 or go to the closest hospital for further evaluation and treatment if pain is worsening, more persistent, difficulty breathing, pain associated with sweating, nausea or vomiting, or other new concerning symptoms develop.

## 2019-04-05 NOTE — Telephone Encounter (Signed)
Patient calls reporting that she is having some episodes where she is relaxed but experiences a feeling of her heart racing along with shortness of breath. Patient states that episodes are not lasting long, but are very uncomfortable and make her feel uneasy.   No appointments available today in office. Patient was advised to be evaluated today. Patient was agreeable to going to urgent care for eval, does not want to go to ER at this time.   FYI to PCP

## 2019-04-06 NOTE — Telephone Encounter (Signed)
If worsening certainly go to ED. If not get in next week. We can do EKG and order labs.

## 2019-05-04 ENCOUNTER — Other Ambulatory Visit: Payer: Self-pay | Admitting: Physician Assistant

## 2019-05-04 DIAGNOSIS — I10 Essential (primary) hypertension: Secondary | ICD-10-CM

## 2019-05-08 ENCOUNTER — Encounter: Payer: Self-pay | Admitting: Physician Assistant

## 2019-05-08 ENCOUNTER — Telehealth (INDEPENDENT_AMBULATORY_CARE_PROVIDER_SITE_OTHER): Payer: BC Managed Care – PPO | Admitting: Physician Assistant

## 2019-05-08 VITALS — BP 149/83 | HR 86 | Ht 71.5 in | Wt 255.0 lb

## 2019-05-08 DIAGNOSIS — R079 Chest pain, unspecified: Secondary | ICD-10-CM

## 2019-05-08 DIAGNOSIS — G35 Multiple sclerosis: Secondary | ICD-10-CM

## 2019-05-08 DIAGNOSIS — R002 Palpitations: Secondary | ICD-10-CM | POA: Diagnosis not present

## 2019-05-08 DIAGNOSIS — Z13220 Encounter for screening for lipoid disorders: Secondary | ICD-10-CM

## 2019-05-08 DIAGNOSIS — R234 Changes in skin texture: Secondary | ICD-10-CM

## 2019-05-08 DIAGNOSIS — Z131 Encounter for screening for diabetes mellitus: Secondary | ICD-10-CM

## 2019-05-08 DIAGNOSIS — N644 Mastodynia: Secondary | ICD-10-CM

## 2019-05-08 DIAGNOSIS — I10 Essential (primary) hypertension: Secondary | ICD-10-CM

## 2019-05-08 DIAGNOSIS — N62 Hypertrophy of breast: Secondary | ICD-10-CM

## 2019-05-08 DIAGNOSIS — M79602 Pain in left arm: Secondary | ICD-10-CM | POA: Diagnosis not present

## 2019-05-08 DIAGNOSIS — Z8241 Family history of sudden cardiac death: Secondary | ICD-10-CM

## 2019-05-08 MED ORDER — TRIAMTERENE-HCTZ 37.5-25 MG PO CAPS: 1.0000 | ORAL_CAPSULE | Freq: Every day | ORAL | 1 refills | Status: DC

## 2019-05-08 MED ORDER — METOPROLOL SUCCINATE ER 50 MG PO TB24: 50.0000 mg | ORAL_TABLET | Freq: Every day | ORAL | 1 refills | Status: DC

## 2019-05-08 NOTE — Progress Notes (Signed)
Patient ID: Jean Yu, female   DOB: January 31, 1981, 38 y.o.   MRN: [id number] .Marland KitchenVirtual Visit via Video Note  I connected with Jean Yu on 05/11/19 at  4:00 PM EST by a video enabled telemedicine application and verified that I am speaking with the correct person using two identifiers.  Location: Patient: home Provider: clinic   I discussed the limitations of evaluation and management by telemedicine and the availability of in person appointments. The patient expressed understanding and agreed to proceed.  History of Present Illness: Pt is a 38 yo female with MS and hx of palpitations and left sided chest pain who calls in to discuss worsening symptoms.   She was seen in UC on 10/8.   Jean Yu is a 38 y.o. female presenting to UC with c/o intermittent chest discomfort and mild SOB for about 2 days. Chest pain is a sharp "grabbing" ache for a few seconds at a time, which takes her breath away.  She had 2 episodes last night and one today while sitting at her desk at work.  Pt feels like her heart is racing at times and it is hard to catch her breath. Pain does not radiate.  Nothing seems to make her symptoms better or worse but they only last a few seconds.  Hx of similar but more severe pain about 2 years ago. She had a stress test at that time and believes she may have needed to f/u but never did. She was started on a new BP medication briefly but once her medication ran out, she was advised to f/u with her PCP. Her BP remained well controlled with her ongoing BP medication so she was never restarted on the secondary medication.  Denies family hx of CAD. No recent URI symptoms- no cough, congestion, fever, chills, n/v/d.   Pt continues to have palpitations and heart racing at rest mostly. No exertional pain/palpitations. She did have to stop taking MS medication because of cost and now tapering back up. She is taking BP medication. She did recently run out. She continues to be  concerned due to her mother sudden cardiac death.   She is also concerned about her breasts. She has bilateral breast pain more right than left. She now noticed a bruise on the lower right breast that will not go away. She has very Yu breast 42G. No masses felt. Maternal aunt BC hx.   .. Family History  Problem Relation Age of Onset  . Hypertension Mother   . Heart attack Mother   . Cancer Maternal Aunt     .[name] Active Ambulatory Problems    Diagnosis Date Noted  . Hypertension 12/18/2008  . MS (multiple sclerosis) ([place]) 12/09/2005  . BMI 33.0-33.9,adult 03/29/2017  . Obstructive sleep apnea 05/28/2017  . Family history of sudden cardiac death in mother May 28, 2017  . Chest pain with low risk of acute coronary syndrome 28-May-2017  . Yu breasts 08/29/2017  . Class 1 obesity due to excess calories with serious comorbidity and body mass index (BMI) of 34.0 to 34.9 in adult 08/29/2017  . Left arm pain 05/11/2019  . Palpitations 05/11/2019  . Breast skin changes 05/11/2019  . Breast tenderness in female 05/11/2019   Resolved Ambulatory Problems    Diagnosis Date Noted  . PHLEBITIS&THROMBOPHLEB SUP VESSELS LOWER EXTREM 12/18/2008  . CRAMP IN LIMB 12/18/2008  . EDEMA LEG 12/18/2008   Past Medical History:  Diagnosis Date  . Borderline high cholesterol   . Obesity   .  OSA (obstructive sleep apnea)    Reviewed med allergy problem list.    Observations/Objective: No acute distress. Normal breathing.  Normal mood and appearance.   .. Today's Vitals   05/08/19 1553  BP: (!) 149/83  Pulse: 86  Weight: 255 lb (115.7 kg)  Height: 5' 11.5" (1.816 m)   Body mass index is 35.07 kg/m.     Assessment and Plan: Marland KitchenMarland KitchenDiagnoses and all orders for this visit:  Left-sided chest pain -     Troponin I -     COMPLETE METABOLIC PANEL WITH GFR -     Ambulatory referral to Cardiology  Essential hypertension -     Troponin I -     COMPLETE METABOLIC PANEL WITH GFR -     Lipid  Panel w/reflex Direct LDL -     TSH -     CBC -     Ferritin -     triamterene-hydrochlorothiazide (DYAZIDE) 37.5-25 MG capsule; Take 1 each (1 capsule total) by mouth daily. -     metoprolol succinate (TOPROL-XL) 50 MG 24 hr tablet; Take 1 tablet (50 mg total) by mouth daily. -     Ambulatory referral to Cardiology  Left arm pain -     Troponin I -     COMPLETE METABOLIC PANEL WITH GFR  Screening for diabetes mellitus -     COMPLETE METABOLIC PANEL WITH GFR  Palpitations -     COMPLETE METABOLIC PANEL WITH GFR -     TSH -     CBC -     Ferritin -     Ambulatory referral to Cardiology  Screening for lipid disorders -     Lipid Panel w/reflex Direct LDL  MS (multiple sclerosis) (HCC) -     Ambulatory referral to Cardiology  Chest pain with low risk of acute coronary syndrome -     Ambulatory referral to Cardiology  Family history of sudden cardiac death in mother -     Ambulatory referral to Cardiology  Yu breasts -     MM DIAG BREAST TOMO BILATERAL  Breast skin changes -     MM DIAG BREAST TOMO BILATERAL  Breast tenderness in female -     MM DIAG BREAST TOMO BILATERAL   Due to families hx of sudden cardiac death I do think patient would benefit from cardiology work up.  Labs ordered.  Refilled medications.  BP not controlled. Keep log and take to cardiology.   Due to breast concerns ordered diagnostic mammogram.   Follow up in 4 weeks.    Follow Up Instructions:    I discussed the assessment and treatment plan with the patient. The patient was provided an opportunity to ask questions and all were answered. The patient agreed with the plan and demonstrated an understanding of the instructions.   The patient was advised to call back or seek an in-person evaluation if the symptoms worsen or if the condition fails to improve as anticipated.    Tandy Gaw, PA-C

## 2019-05-08 NOTE — Progress Notes (Signed)
Left message to start the pre- visit and the office call back number. Will try again in a few minutes( 03:23pm).    She is having some chest discomfort throughout the day. She has been having some left arm pain, no numbness, and this has become worse in the last 2 weeks.

## 2019-05-11 DIAGNOSIS — R002 Palpitations: Secondary | ICD-10-CM | POA: Insufficient documentation

## 2019-05-11 DIAGNOSIS — R234 Changes in skin texture: Secondary | ICD-10-CM | POA: Insufficient documentation

## 2019-05-11 DIAGNOSIS — N644 Mastodynia: Secondary | ICD-10-CM | POA: Insufficient documentation

## 2019-05-11 DIAGNOSIS — M79602 Pain in left arm: Secondary | ICD-10-CM | POA: Insufficient documentation

## 2019-05-22 ENCOUNTER — Other Ambulatory Visit: Payer: Self-pay | Admitting: Physician Assistant

## 2019-05-22 DIAGNOSIS — N62 Hypertrophy of breast: Secondary | ICD-10-CM

## 2019-05-22 DIAGNOSIS — R234 Changes in skin texture: Secondary | ICD-10-CM

## 2019-05-22 DIAGNOSIS — N644 Mastodynia: Secondary | ICD-10-CM

## 2019-05-30 ENCOUNTER — Other Ambulatory Visit: Payer: BC Managed Care – PPO

## 2019-06-08 ENCOUNTER — Other Ambulatory Visit: Payer: Self-pay

## 2019-06-08 ENCOUNTER — Ambulatory Visit
Admission: RE | Admit: 2019-06-08 | Discharge: 2019-06-08 | Disposition: A | Discharge status: Home / Self Care | Payer: BC Managed Care – PPO | Source: Ambulatory Visit | Attending: Physician Assistant | Admitting: Physician Assistant

## 2019-06-08 ENCOUNTER — Ambulatory Visit: Payer: BC Managed Care – PPO

## 2019-06-08 DIAGNOSIS — R928 Other abnormal and inconclusive findings on diagnostic imaging of breast: Secondary | ICD-10-CM | POA: Diagnosis not present

## 2019-06-08 NOTE — Progress Notes (Deleted)
Referring-[name] Breeback PA-C Reason for referral-chest pain and palpitations  HPI: 38 year old female for evaluation of chest pain and palpitations at request of Tandy Gaw PA-C.  Current Outpatient Medications  Medication Sig Dispense Refill  . Ascorbic Acid (VITAMIN C) 1000 MG tablet Take 1,000 mg by mouth daily.    . cholecalciferol (VITAMIN D) 1000 units tablet Take 1,000 Units by mouth daily.    . Dimethyl Fumarate (TECFIDERA) 240 MG CPDR Take by mouth.    . metoprolol succinate (TOPROL-XL) 50 MG 24 hr tablet Take 1 tablet (50 mg total) by mouth daily. 90 tablet 1  . triamterene-hydrochlorothiazide (DYAZIDE) 37.5-25 MG capsule Take 1 each (1 capsule total) by mouth daily. 90 capsule 1  . vitamin B-12 (CYANOCOBALAMIN) 1000 MCG tablet Take by mouth.     No current facility-administered medications for this visit.    No Known Allergies  Past Medical History:  Diagnosis Date  . Borderline high cholesterol   . Hypertension   . MS (multiple sclerosis) (HCC) 12/09/2005  . Obesity   . OSA (obstructive sleep apnea)     No past surgical history on file.  Social History   Socioeconomic History  . Marital status: Single    Spouse name: Not on file  . Number of children: Not on file  . Years of education: Not on file  . Highest education level: Not on file  Occupational History  . Not on file  Tobacco Use  . Smoking status: Former Games developer  . Smokeless tobacco: Never Used  Substance and Sexual Activity  . Alcohol use: Yes    Alcohol/week: 0.0 standard drinks  . Drug use: No  . Sexual activity: Not Currently  Other Topics Concern  . Not on file  Social History Narrative  . Not on file   Social Determinants of Health   Financial Resource Strain:   . Difficulty of Paying Living Expenses: Not on file  Food Insecurity:   . Worried About Programme researcher, broadcasting/film/video in the Last Year: Not on file  . Ran Out of Food in the Last Year: Not on file  Transportation Needs:   .  Lack of Transportation (Medical): Not on file  . Lack of Transportation (Non-Medical): Not on file  Physical Activity:   . Days of Exercise per Week: Not on file  . Minutes of Exercise per Session: Not on file  Stress:   . Feeling of Stress : Not on file  Social Connections:   . Frequency of Communication with Friends and Family: Not on file  . Frequency of Social Gatherings with Friends and Family: Not on file  . Attends Religious Services: Not on file  . Active Member of Clubs or Organizations: Not on file  . Attends Banker Meetings: Not on file  . Marital Status: Not on file  Intimate Partner Violence:   . Fear of Current or Ex-Partner: Not on file  . Emotionally Abused: Not on file  . Physically Abused: Not on file  . Sexually Abused: Not on file    Family History  Problem Relation Age of Onset  . Hypertension Mother   . Heart attack Mother   . Cancer Maternal Aunt     ROS: no fevers or chills, productive cough, hemoptysis, dysphasia, odynophagia, melena, hematochezia, dysuria, hematuria, rash, seizure activity, orthopnea, PND, pedal edema, claudication. Remaining systems are negative.  Physical Exam:   There were no vitals taken for this visit.  General:  Well developed/well nourished in  NAD Skin warm/dry Patient not depressed No peripheral clubbing Back-normal HEENT-normal/normal eyelids Neck supple/normal carotid upstroke bilaterally; no bruits; no JVD; no thyromegaly chest - CTA/ normal expansion CV - RRR/normal S1 and S2; no murmurs, rubs or gallops;  PMI nondisplaced Abdomen -NT/ND, no HSM, no mass, + bowel sounds, no bruit 2+ femoral pulses, no bruits Ext-no edema, chords, 2+ DP Neuro-grossly nonfocal  ECG -April 05, 2019-sinus rhythm with nonspecific ST changes.  Personally reviewed  A/P  1 chest pain-  2 palpitations-  3 hypertension-blood pressure is controlled.  Continue present medications and follow.  Kirk Ruths, MD

## 2019-06-08 NOTE — Progress Notes (Signed)
Normal mammogram

## 2019-06-13 ENCOUNTER — Ambulatory Visit: Payer: BC Managed Care – PPO | Admitting: Cardiology

## 2019-06-15 DIAGNOSIS — Z03818 Encounter for observation for suspected exposure to other biological agents ruled out: Secondary | ICD-10-CM | POA: Diagnosis not present

## 2019-07-02 ENCOUNTER — Other Ambulatory Visit: Payer: Self-pay

## 2019-07-02 ENCOUNTER — Encounter: Payer: Self-pay | Admitting: Physician Assistant

## 2019-07-02 ENCOUNTER — Ambulatory Visit (INDEPENDENT_AMBULATORY_CARE_PROVIDER_SITE_OTHER): Payer: BC Managed Care – PPO | Admitting: Physician Assistant

## 2019-07-02 ENCOUNTER — Ambulatory Visit: Payer: BC Managed Care – PPO

## 2019-07-02 VITALS — BP 123/83 | HR 65 | Ht 71.0 in | Wt 246.0 lb

## 2019-07-02 DIAGNOSIS — M7989 Other specified soft tissue disorders: Secondary | ICD-10-CM

## 2019-07-02 DIAGNOSIS — M79605 Pain in left leg: Secondary | ICD-10-CM

## 2019-07-02 DIAGNOSIS — M7052 Other bursitis of knee, left knee: Secondary | ICD-10-CM

## 2019-07-02 NOTE — Progress Notes (Deleted)
Pt complains of L lower leg pain for 3 days. She noticed a quarter sized bruise accompanied by pain. Once the bruise faded she noticed swelling and squeezing pain "like a blood pressure cuff" over the entire leg. On 06/30/2018 pt reported 10 out of 10 pain. Currently reports pain as a 4 out of 10. Pt reports tingling down the leg and through to the toes. Pt denies weakness, recent fall/trauma, surgeries, or redness.   Upon physical exam there was no obvious deformity, redness, ulceraction, bruising, or deformities. Swelling was noticed and measured at 18'' on the L compared to 17.5" on the R. Pt was tender upon palpation of the medial side of the L leg. Full ROM, strength 5/5, bilaterally. Homan's Sign Negative.

## 2019-07-02 NOTE — Progress Notes (Signed)
Normal u/s. No DVT. Wear compression stockings and ibuprofen 800mg  three times a day for next 5-7 days. Warm compresses over painful area. Follow up as needed or if symptoms not improving call office.

## 2019-07-02 NOTE — Patient Instructions (Addendum)
Pes Anserine Bursitis  The pes anserine is an area on the inside of your knee, just below the joint, that is cushioned by a fluid-filled sac (bursa). Pes anserine bursitis is a condition that happens when the bursa gets swollen and irritated. The condition causes knee pain. What are the causes? This condition may be caused by:  Making the same movement over and over.  A direct hit (trauma) to the inside of the leg. What increases the risk? You are more likely to develop this condition if you:  Are a runner.  Play sports that involve a lot of running and quick side-to-side movements (cutting).  Are an athlete who plays contact sports.  Swim using an inward angle of the knee, such as with the breaststroke.  Have tight hamstring muscles.  Are a woman.  Are overweight.  Have flat feet.  Have diabetes or osteoarthritis. What are the signs or symptoms? Symptoms of this condition include:  Knee pain that gets better with rest and worse with activities like climbing stairs, walking, running, or getting in and out of a chair.  Swelling.  Warmth.  Tenderness when pressing at the inside of the lower leg, just below the knee. How is this diagnosed? This condition may be diagnosed based on:  Your symptoms.  Your medical history.  A physical exam. ? During your physical exam, your health care provider will press on the tendon attachment to see if you feel pain. ? Your health care provider may also check your hip and knee motion and strength.  Tests to check for swelling and fluid buildup in the bursa and to look at muscles, bones, and tendons. These tests might include: ? X-rays. ? MRI. ? Ultrasound. How is this treated? This condition may be treated by:  Resting your knee. You may be told to raise (elevate) your knee while resting.  Avoiding activities that cause pain.  Icing the inside of your knee.  Sleeping with a pillow between your knees. This will cushion  your injured knee.  Taking medicine by mouth (orally) to reduce pain and swelling or having medicine injected into your knee.  Doing strengthening and stretching exercises (physical therapy). If these treatments do not work or if the condition keeps coming back, you may need to have surgery to remove the bursa. Follow these instructions at home: Managing pain, stiffness, and swelling   If directed, put ice on the injured area. ? Put ice in a plastic bag. ? Place a towel between your skin and the bag. ? Leave the ice on for 20 minutes, 2-3 times a day.  Elevate the injured area above the level of your heart while you are sitting or lying down. Activity  Return to your normal activities as told by your health care provider. Ask your health care provider what activities are safe for you.  Do exercises as told by your health care provider. General instructions  Take over-the-counter and prescription medicines only as told by your health care provider.  Sleep with a pillow between your knees.  Do not use any products that contain nicotine or tobacco, such as cigarettes, e-cigarettes, and chewing tobacco. These can delay healing. If you need help quitting, ask your health care provider.  If you are overweight, work with your health care provider and a dietitian to set a weight-loss goal that is healthy and reasonable for you.  Keep all follow-up visits as told by your health care provider. This is important. How is  this prevented?  When exercising, make sure that you: ? Warm up and stretch before being active. ? Cool down and stretch after being active. ? Give your body time to rest between periods of activity. ? Use equipment that fits you. ? Are safe and responsible while being active to avoid falls. ? Do at least 150 minutes of moderate-intensity exercise each week, such as brisk walking or water aerobics. ? Maintain physical fitness,  including:  Strength.  Flexibility.  Cardiovascular fitness.  Endurance. ? Maintain a healthy weight. Contact a health care provider if:  Your symptoms do not improve.  Your symptoms get worse. Summary  Pes anserine bursitis is a condition that happens when the fluid-filled sac (bursa) at the inside of your knee gets swollen and irritated. The condition causes knee pain.  Treatment for pes anserine bursitis may include resting your knee, icing the inside of your knee, sleeping with a pillow between your knees, taking medicine by mouth or by injection, and doing strengthening and stretching exercises (physical therapy).  Follow instructions for managing pain, stiffness, and swelling.  Take over-the-counter and prescription medicines only as told by your health care provider. This information is not intended to replace advice given to you by your health care provider. Make sure you discuss any questions you have with your health care provider. Document Revised: 10/05/2018 Document Reviewed: 11/23/2017 Elsevier Patient Education  2020 [employer]. Thrombophlebitis Thrombophlebitis is a condition in which a blood clot forms in a vein. This can happen in your arms or legs, or in the area between your neck and groin (torso). When this condition happens in a vein that is close to the surface of the body (superficial thrombophlebitis), it is usually not serious.However, when the condition happens in a vein that is deep inside the body (deep vein thrombosis, DVT), it can cause serious problems. What are the causes? This condition may be caused by:  Damage to a vein.  Inflammation of the veins.  A condition that causes blood to clot more easily.  Reduced blood flow through the veins. What increases the risk? The following factors may make you more likely to develop this condition:  Having a condition that makes blood thicker or more likely to clot.  Having an infection.  Having  major surgery.  Experiencing a traumatic injury or a broken bone.  Having a catheter in a vein (central line).  Having a condition in which valves in the veins do not work properly, causing blood to collect (pool) in the veins (chronic venous insufficiency).  An inactive (sedentary) lifestyle.  Pregnancy or having recently given birth.  Cancer.  Older age, especially being 70 or older.  Obesity.  Smoking.  Taking medicines that contain estrogen, such as birth control pills.  Having varicose veins.  Using drugs that are injected into the veins (intravenous, IV). What are the signs or symptoms? The main symptoms of this condition are:  Swelling and pain in an arm or leg. If the affected vein is in the leg, you may feel pain while standing or walking.  Warmth or redness in an arm or leg. Other symptoms include:  Low-grade fever.  Muscle aches.  A bulging vein (venous distension). In some cases, there are no symptoms. How is this diagnosed? This condition may be diagnosed based on:  Your symptoms and medical history.  A physical exam.  Tests, such as: ? Blood tests. ? A test that uses sound waves to make images (ultrasound). How is this  treated? Treatment depends on how severe the condition is and which area of the body is affected. Treatment may include:  Applying a warm compress or heating pad to affected areas.  Wearing compression stockings to help prevent blood clots and reduce swelling in your legs.  Raising (elevating) the affected arm or leg above the level of your heart.  Medicines, such as: ? Anti-inflammatory medicines, such as ibuprofen. ? Blood thinners (anticoagulants), such as heparin. ? Antibiotic medicine, if you have an infection.  Removing an IV that may be causing the problem. In rare cases, surgery may be needed to:  Remove a damaged section of a vein.  Place a filter in a large vein to catch blood clots before they reach the  lungs. Follow these instructions at home: Medicines  Take over-the-counter and prescription medicines only as told by your health care provider.  If you were prescribed an antibiotic, take it as told by your health care provider. Do not stop using the antibiotic even if you feel better. Managing pain, stiffness, and swelling   If directed, put heat on the affected area as often as told by your health care provider. Use the heat source that your health care provider recommends, such as a moist heat pack or a heating pad. ? Place a towel between your skin and the heat source. ? Leave the heat on for 20-30 minutes. ? Remove the heat if your skin turns bright red. This is especially important if you are not able to feel pain, heat, or cold. You may have a greater risk of getting burned.  Elevate the affected area above the level of your heart while you are sitting or lying down. Activity  Return to your normal activities as told by your health care provider. Ask your health care provider what activities are safe for you.  Avoid sitting or lying down for long periods. If possible, stand up and walk around regularly. If you are taking blood thinners:  Take your medicine exactly as told, at the same time every day.  Avoid activities that could cause injury or bruising, and follow instructions about how to prevent falls.  Wear a medical alert bracelet or carry a card that lists what medicines you take. General instructions  Drink enough fluid to keep your urine pale yellow.  Wear compression stockings as told by your health care provider.  Do not use any products that contain nicotine or tobacco, such as cigarettes and e-cigarettes. If you need help quitting, ask your health care provider.  Keep all follow-up visits as told by your health care provider. This is important. Contact a health care provider if:  You miss a dose of your blood thinner, if applicable.  Your symptoms do not  improve.  You have unusual bruising.  You have nausea, vomiting, or diarrhea that lasts for more than one day. Get help right away if:  You have any of these problems: ? New or worse pain, swelling, or redness in an arm or leg. ? Numbness or tingling in an arm or leg. ? Shortness of breath. ? Chest pain. ? Severe pain in your abdomen. ? Fast breathing. ? A fast or irregular heartbeat. ? Blood in your vomit, stool, or urine. ? A severe headache or confusion. ? A cut that does not stop bleeding.  You feel light-headed or dizzy.  You cough up blood.  You have a serious fall or accident, or you hit your head. These symptoms may represent a serious  problem that is an emergency. Do not wait to see if the symptoms will go away. Get medical help right away. Call your local emergency services (911 in the [place] Do not drive yourself to the hospital. Summary  Thrombophlebitis is a condition in which a blood clot forms in a vein. This can happen in a vein close to the surface of the body or a vein deep inside the body.  This condition can cause serious problems when it happens in a vein deep inside the body (deep vein thrombosis, DVT).  The main symptom of this condition is swelling and pain around the affected vein.  Treatment may include warm compresses, anti-inflammatory medicines, or blood thinners. This information is not intended to replace advice given to you by your health care provider. Make sure you discuss any questions you have with your health care provider. Document Revised: 10/04/2018 Document Reviewed: 12/08/2016 Elsevier Patient Education  [employer].

## 2019-07-03 ENCOUNTER — Telehealth: Payer: Self-pay | Admitting: Physician Assistant

## 2019-07-03 MED ORDER — IBUPROFEN 800 MG PO TABS: 800.0000 mg | ORAL_TABLET | Freq: Three times a day (TID) | ORAL | 0 refills | Status: DC | PRN

## 2019-07-03 NOTE — Telephone Encounter (Signed)
Patient made aware of recommendations. She asked that exercises be mailed to her. Mailed.

## 2019-07-03 NOTE — Progress Notes (Signed)
Subjective:    Patient ID: Jean Yu, female    DOB: 1981-06-08, 39 y.o.   MRN: [id number]  HPI Pt is a 39 yo female with MS who complains of L lower leg pain for 3 days. She noticed a quarter sized bruise accompanied by pain. Once the bruise faded she noticed swelling and squeezing pain "like a blood pressure cuff" over the entire leg. On 06/30/2018 pt reported 10 out of 10 pain. Currently reports pain as a 4 out of 10. Pt reports tingling down the leg and through to the toes. Pt denies weakness, recent fall/trauma, surgeries, travel or redness. She is not SOB. She has not started any new medications. She has been doing more walking than she normally does.   No fever, chills, body aches.   .. Active Ambulatory Problems    Diagnosis Date Noted  . Hypertension 12/18/2008  . MS (multiple sclerosis) ([facility]) 12/09/2005  . BMI 33.0-33.9,adult 03/29/2017  . Obstructive sleep apnea 2017/05/15  . Family history of sudden cardiac death in mother 2017/05/15  . Chest pain with low risk of acute coronary syndrome 05-15-17  . Large breasts 08/29/2017  . Class 1 obesity due to excess calories with serious comorbidity and body mass index (BMI) of 34.0 to 34.9 in adult 08/29/2017  . Left arm pain 05/11/2019  . Palpitations 05/11/2019  . Breast skin changes 05/11/2019  . Breast tenderness in female 05/11/2019   Resolved Ambulatory Problems    Diagnosis Date Noted  . PHLEBITIS&THROMBOPHLEB SUP VESSELS LOWER EXTREM 12/18/2008  . CRAMP IN LIMB 12/18/2008  . EDEMA LEG 12/18/2008   Past Medical History:  Diagnosis Date  . Borderline high cholesterol   . Obesity   . OSA (obstructive sleep apnea)     Review of Systems See HPI>     Objective:   Physical Exam Vitals reviewed.  Constitutional:      Appearance: Normal appearance. She is obese.  Cardiovascular:     Rate and Rhythm: Normal rate.     Pulses: Normal pulses.  Pulmonary:     Effort: Pulmonary effort is normal.   Musculoskeletal:     Comments: Left Leg:   Upon physical exam there was no obvious deformity, redness, ulceraction, bruising, or deformities. Swelling was noticed and measured at 18'' on the L compared to 17.5" on the R. Pt was tender upon palpation of the medial side of the L leg. No tenderness over the calf. There was a pocket of edema under left medial knee that was tender but not warm or red. This likely is inflammation of pes anserine bursa.  Full ROM, strength 5/5, bilaterally. Homan's Sign Negative.   Neurological:     General: No focal deficit present.     Mental Status: She is alert and oriented to person, place, and time.  Psychiatric:        Mood and Affect: Mood normal.           Assessment & Plan:  Marland KitchenMarland KitchenKashay was seen today for leg pain.  Diagnoses and all orders for this visit:  Left leg swelling -     US Venous Img Lower Unilateral Left  Left leg pain -     US Venous Img Lower Unilateral Left  Pes anserinus bursitis of left knee -     ibuprofen (ADVIL) 800 MG tablet; Take 1 tablet (800 mg total) by mouth every 8 (eight) hours as needed.   Need to rule out DVT. With tenderness over medial leg could  be some inflammation of great saphenous vein.  STAT venous dopper was negative for any blood clots.   Pt did have swelling over pes anserine and suspect there is some inflammation over that bursa. Ibuprofen 800mg  TId for next 7-10 days, ice regularly, elevate and rest, get OTC knee sleeve to wear for next 2 weeks. Start stretches that can help. Follow up as needed or in 2 weeks.

## 2019-07-03 NOTE — Telephone Encounter (Signed)
I called last night but got voicemail.  Please call with treatment plan this morning.  [place] negative for any blood clots. GREAT News.   Pt did have swelling over pes anserine bursa and suspect there is some inflammation over that bursa. Ibuprofen 800mg  TId for next 7-10 days, ice regularly, elevate and rest, get OTC knee sleeve to wear for next 2 weeks. Start stretches that can help. Follow up as needed.   will you please print exercises and get to patient for pes anserine bursitis rehab

## 2019-07-06 NOTE — Progress Notes (Signed)
Referring-Jean Breeback PA-C Reason for referral-palpitations  HPI: 39 year old female for evaluation of palpitations at request of Tandy Gaw PA-C.  Lower extremity venous Dopplers July 02, 2019 showed no DVT on the left.  Patient states that over the past 2 weeks he has had several episodes of palpitations.  These are described as her heart racing suddenly.  They last from 5 to 10 seconds and resolve spontaneously.  There was associated dizziness, mild dyspnea and mild chest tightness.  They resolve spontaneously.  She otherwise denies dyspnea on exertion, orthopnea, PND, pedal edema, exertional chest pain or syncope.  Cardiology now asked to evaluate.  Current Outpatient Medications  Medication Sig Dispense Refill  . Ascorbic Acid (VITAMIN C) 1000 MG tablet Take 1,000 mg by mouth daily.    . cholecalciferol (VITAMIN D) 1000 units tablet Take 1,000 Units by mouth daily.    . Dimethyl Fumarate (TECFIDERA) 240 MG CPDR Take by mouth.    [name] ibuprofen (ADVIL) 800 MG tablet Take 1 tablet (800 mg total) by mouth every 8 (eight) hours as needed. 60 tablet 0  . metoprolol succinate (TOPROL-XL) 50 MG 24 hr tablet Take 1 tablet (50 mg total) by mouth daily. 90 tablet 1  . triamterene-hydrochlorothiazide (DYAZIDE) 37.5-25 MG capsule Take 1 each (1 capsule total) by mouth daily. 90 capsule 1  . vitamin B-12 (CYANOCOBALAMIN) 1000 MCG tablet Take by mouth.     No current facility-administered medications for this visit.    No Known Allergies   Past Medical History:  Diagnosis Date  . Borderline high cholesterol   . Hypertension   . MS (multiple sclerosis) ([facility]) 12/09/2005  . Obesity   . OSA (obstructive sleep apnea)     Past Surgical History:  Procedure Laterality Date  . No prior surgery      Social History   Socioeconomic History  . Marital status: Single    Spouse name: Not on file  . Number of children: Not on file  . Years of education: Not on file  . Highest education  level: Not on file  Occupational History  . Not on file  Tobacco Use  . Smoking status: Former Games developer  . Smokeless tobacco: Never Used  Substance and Sexual Activity  . Alcohol use: Yes    Alcohol/week: 0.0 standard drinks    Comment: Rarely  . Drug use: No  . Sexual activity: Not Currently  Other Topics Concern  . Not on file  Social History Narrative  . Not on file   Social Determinants of Health   Financial Resource Strain:   . Difficulty of Paying Living Expenses: Not on file  Food Insecurity:   . Worried About Programme researcher, broadcasting/film/video in the Last Year: Not on file  . Ran Out of Food in the Last Year: Not on file  Transportation Needs:   . Lack of Transportation (Medical): Not on file  . Lack of Transportation (Non-Medical): Not on file  Physical Activity:   . Days of Exercise per Week: Not on file  . Minutes of Exercise per Session: Not on file  Stress:   . Feeling of Stress : Not on file  Social Connections:   . Frequency of Communication with Friends and Family: Not on file  . Frequency of Social Gatherings with Friends and Family: Not on file  . Attends Religious Services: Not on file  . Active Member of Clubs or Organizations: Not on file  . Attends Banker Meetings: Not on  file  . Marital Status: Not on file  Intimate Partner Violence:   . Fear of Current or Ex-Partner: Not on file  . Emotionally Abused: Not on file  . Physically Abused: Not on file  . Sexually Abused: Not on file    Family History  Problem Relation Age of Onset  . Hypertension Mother   . Heart attack Mother   . Cancer Maternal Aunt     ROS: no fevers or chills, productive cough, hemoptysis, dysphasia, odynophagia, melena, hematochezia, dysuria, hematuria, rash, seizure activity, orthopnea, PND, pedal edema, claudication. Remaining systems are negative.  Physical Exam:   Blood pressure (!) 138/98, pulse 80, height 5\' 11"  (1.803 m), weight 247 lb (112 kg).  General:  Well  developed/well nourished in NAD Skin warm/dry Patient not depressed No peripheral clubbing Back-normal HEENT-normal/normal eyelids Neck supple/normal carotid upstroke bilaterally; no bruits; no JVD; no thyromegaly chest - CTA/ normal expansion CV - RRR/normal S1 and S2; no murmurs, rubs or gallops;  PMI nondisplaced Abdomen -NT/ND, no HSM, no mass, + bowel sounds, no bruit 2+ femoral pulses, no bruits Ext-no edema, chords, 2+ DP Neuro-grossly nonfocal  ECG -sinus rhythm at a rate of 77, inferolateral T wave inversion.  Personally reviewed  A/P  1 palpitations-symptoms sound concerning for supraventricular tachycardia.  We will arrange an event monitor to further assess.  She had laboratories ordered recently including a TSH and we will await those results.  I will arrange an echocardiogram to assess LV function.  Continue metoprolol for now.  2 dyspnea-she notes some dyspnea on exertion.  We will arrange an echocardiogram to assess LV function.  3 Hypertension-patient's blood pressure is elevated today.  However she follows this at home and it is typically controlled.  Continue present medications and follow.  Advance as needed.  Kirk Ruths, MD

## 2019-07-11 ENCOUNTER — Ambulatory Visit: Payer: BC Managed Care – PPO | Admitting: Cardiology

## 2019-07-11 ENCOUNTER — Encounter: Payer: Self-pay | Admitting: Cardiology

## 2019-07-11 ENCOUNTER — Other Ambulatory Visit: Payer: Self-pay

## 2019-07-11 VITALS — BP 138/98 | HR 80 | Ht 71.0 in | Wt 247.0 lb

## 2019-07-11 DIAGNOSIS — I10 Essential (primary) hypertension: Secondary | ICD-10-CM

## 2019-07-11 DIAGNOSIS — R0602 Shortness of breath: Secondary | ICD-10-CM | POA: Diagnosis not present

## 2019-07-11 DIAGNOSIS — R079 Chest pain, unspecified: Secondary | ICD-10-CM | POA: Diagnosis not present

## 2019-07-11 DIAGNOSIS — R002 Palpitations: Secondary | ICD-10-CM | POA: Diagnosis not present

## 2019-07-11 NOTE — Patient Instructions (Signed)
Medication Instructions:  NO CHANGE *If you need a refill on your cardiac medications before your next appointment, please call your pharmacy*  Lab Work: If you have labs (blood work) drawn today and your tests are completely normal, you will receive your results only by: [name] MyChart Message (if you have MyChart) OR . A paper copy in the mail If you have any lab test that is abnormal or we need to change your treatment, we will call you to review the results.  Testing/Procedures: Your physician has requested that you have an echocardiogram. Echocardiography is a painless test that uses sound waves to create images of your heart. It provides your doctor with information about the size and shape of your heart and how well your heart's chambers and valves are working. This procedure takes approximately one hour. There are no restrictions for this procedure.[address] [facility]-1ST FLOOR IMAGING DEPARTMENT  Your physician has recommended that you wear a 30 DAY event monitor. Event monitors are medical devices that record the heart's electrical activity. Doctors most often [place] these monitors to diagnose arrhythmias. Arrhythmias are problems with the speed or rhythm of the heartbeat. The monitor is a small, portable device. You can wear one while you do your normal daily activities. This is usually used to diagnose what is causing palpitations/syncope (passing out).   Follow-Up: At [facility], you and your health needs are our priority.  As part of our continuing mission to provide you with exceptional heart care, we have created designated Provider Care Teams.  These Care Teams include your primary Cardiologist (physician) and Advanced Practice Providers (APPs -  Physician Assistants and Nurse Practitioners) who all work together to provide you with the care you need, when you need it.  Your next appointment:   8 week(s)  The format for your next appointment:   Either In  Person or Virtual  Provider:   Olga Millers, MD

## 2019-07-12 ENCOUNTER — Telehealth: Payer: Self-pay | Admitting: Radiology

## 2019-07-12 NOTE — Telephone Encounter (Signed)
Enrolled patient for a 30 day [employer] event monitor to be mailed to patients home  

## 2019-07-13 ENCOUNTER — Telehealth: Payer: Self-pay | Admitting: Physician Assistant

## 2019-07-13 NOTE — Telephone Encounter (Signed)
Patient would like to know, based on her condition does her PCP recommend getting the COVID vaccine.

## 2019-07-16 NOTE — Telephone Encounter (Signed)
Patient made aware.

## 2019-07-16 NOTE — Telephone Encounter (Signed)
As of right now there is no known contraindication to getting the covid vaccine. When you have more risk factors to complications of covid that is more reason to get the vaccine. When it is your phase I do recommend it.

## 2019-07-17 ENCOUNTER — Other Ambulatory Visit (HOSPITAL_BASED_OUTPATIENT_CLINIC_OR_DEPARTMENT_OTHER): Payer: BC Managed Care – PPO

## 2019-07-20 ENCOUNTER — Ambulatory Visit (HOSPITAL_BASED_OUTPATIENT_CLINIC_OR_DEPARTMENT_OTHER)
Admission: RE | Admit: 2019-07-20 | Discharge: 2019-07-20 | Disposition: A | Discharge status: Home / Self Care | Payer: BC Managed Care – PPO | Source: Ambulatory Visit | Attending: Cardiology | Admitting: Cardiology

## 2019-07-20 ENCOUNTER — Encounter (INDEPENDENT_AMBULATORY_CARE_PROVIDER_SITE_OTHER): Payer: BC Managed Care – PPO

## 2019-07-20 ENCOUNTER — Other Ambulatory Visit: Payer: Self-pay

## 2019-07-20 DIAGNOSIS — R002 Palpitations: Secondary | ICD-10-CM

## 2019-07-20 DIAGNOSIS — R079 Chest pain, unspecified: Secondary | ICD-10-CM

## 2019-07-20 NOTE — Progress Notes (Signed)
  Echocardiogram 2D Echocardiogram has been performed.  Jean Yu 07/20/2019, 1:45 PM

## 2019-07-23 DIAGNOSIS — G35 Multiple sclerosis: Secondary | ICD-10-CM | POA: Diagnosis not present

## 2019-07-23 DIAGNOSIS — G8929 Other chronic pain: Secondary | ICD-10-CM | POA: Insufficient documentation

## 2019-07-23 DIAGNOSIS — M5442 Lumbago with sciatica, left side: Secondary | ICD-10-CM | POA: Diagnosis not present

## 2019-07-23 DIAGNOSIS — M79605 Pain in left leg: Secondary | ICD-10-CM | POA: Diagnosis not present

## 2019-07-23 DIAGNOSIS — R2681 Unsteadiness on feet: Secondary | ICD-10-CM | POA: Diagnosis not present

## 2019-07-23 DIAGNOSIS — Z79899 Other long term (current) drug therapy: Secondary | ICD-10-CM | POA: Diagnosis not present

## 2019-07-25 ENCOUNTER — Telehealth: Payer: Self-pay | Admitting: Cardiology

## 2019-07-25 NOTE — Telephone Encounter (Signed)
Spoke with pt, patient will call the company for supplies.

## 2019-07-25 NOTE — Telephone Encounter (Signed)
Patient called regarding her heart monitor  She is using the last sticky strip currently  The strips are irritating her skin therefore she is having to change them more often.  Patient wants to know if it is possible to get more strips to last her until she is finished wearing monitor  Requesting call back   [phone]    Thanks

## 2019-08-23 DIAGNOSIS — M5136 Other intervertebral disc degeneration, lumbar region: Secondary | ICD-10-CM | POA: Insufficient documentation

## 2019-08-23 DIAGNOSIS — M79605 Pain in left leg: Secondary | ICD-10-CM | POA: Diagnosis not present

## 2019-08-23 DIAGNOSIS — M545 Low back pain: Secondary | ICD-10-CM | POA: Diagnosis not present

## 2019-08-23 DIAGNOSIS — G8929 Other chronic pain: Secondary | ICD-10-CM | POA: Diagnosis not present

## 2019-08-23 DIAGNOSIS — M5442 Lumbago with sciatica, left side: Secondary | ICD-10-CM | POA: Diagnosis not present

## 2019-08-29 ENCOUNTER — Telehealth: Payer: Self-pay

## 2019-08-29 NOTE — Telephone Encounter (Signed)
Jean Yu states she received her last Moderna and now she has body aches, fever and chills. She wanted to know if she can take ibuprofen. Please advise.

## 2019-08-29 NOTE — Telephone Encounter (Signed)
Patient advised of recommendations.  

## 2019-08-29 NOTE — Telephone Encounter (Signed)
Yes she can take up to 800mg  of ibuprofen up to three times a day for next 3 days. Stay hydrated and rest!

## 2019-09-19 DIAGNOSIS — M79604 Pain in right leg: Secondary | ICD-10-CM | POA: Diagnosis not present

## 2019-09-19 DIAGNOSIS — R5383 Other fatigue: Secondary | ICD-10-CM | POA: Diagnosis not present

## 2019-09-19 DIAGNOSIS — M545 Low back pain: Secondary | ICD-10-CM | POA: Diagnosis not present

## 2019-09-19 DIAGNOSIS — G35 Multiple sclerosis: Secondary | ICD-10-CM | POA: Diagnosis not present

## 2019-09-21 NOTE — Progress Notes (Deleted)
HPI: Follow-up palpitations.  Echocardiogram January 2021 showed normal LV function.  Monitor February 2021 showed sinus bradycardia, normal sinus rhythm and sinus tachycardia.  Since last seen  Current Outpatient Medications  Medication Sig Dispense Refill  . Ascorbic Acid (VITAMIN C) 1000 MG tablet Take 1,000 mg by mouth daily.    . cholecalciferol (VITAMIN D) 1000 units tablet Take 1,000 Units by mouth daily.    . Dimethyl Fumarate (TECFIDERA) 240 MG CPDR Take by mouth.    [name] ibuprofen (ADVIL) 800 MG tablet Take 1 tablet (800 mg total) by mouth every 8 (eight) hours as needed. 60 tablet 0  . metoprolol succinate (TOPROL-XL) 50 MG 24 hr tablet Take 1 tablet (50 mg total) by mouth daily. 90 tablet 1  . triamterene-hydrochlorothiazide (DYAZIDE) 37.5-25 MG capsule Take 1 each (1 capsule total) by mouth daily. 90 capsule 1  . vitamin B-12 (CYANOCOBALAMIN) 1000 MCG tablet Take by mouth.     No current facility-administered medications for this visit.     Past Medical History:  Diagnosis Date  . Borderline high cholesterol   . Hypertension   . MS (multiple sclerosis) (HCC) 12/09/2005  . Obesity   . OSA (obstructive sleep apnea)     Past Surgical History:  Procedure Laterality Date  . No prior surgery      Social History   Socioeconomic History  . Marital status: Single    Spouse name: Not on file  . Number of children: Not on file  . Years of education: Not on file  . Highest education level: Not on file  Occupational History  . Not on file  Tobacco Use  . Smoking status: Former Games developer  . Smokeless tobacco: Never Used  Substance and Sexual Activity  . Alcohol use: Yes    Alcohol/week: 0.0 standard drinks    Comment: Rarely  . Drug use: No  . Sexual activity: Not Currently  Other Topics Concern  . Not on file  Social History Narrative  . Not on file   Social Determinants of Health   Financial Resource Strain:   . Difficulty of Paying Living Expenses:     Food Insecurity:   . Worried About Programme researcher, broadcasting/film/video in the Last Year:   . Barista in the Last Year:   Transportation Needs:   . Freight forwarder (Medical):   [name] Lack of Transportation (Non-Medical):   Physical Activity:   . Days of Exercise per Week:   . Minutes of Exercise per Session:   Stress:   . Feeling of Stress :   Social Connections:   . Frequency of Communication with Friends and Family:   . Frequency of Social Gatherings with Friends and Family:   . Attends Religious Services:   . Active Member of Clubs or Organizations:   . Attends Banker Meetings:   [name] Marital Status:   Intimate Partner Violence:   . Fear of Current or Ex-Partner:   . Emotionally Abused:   [name] Physically Abused:   . Sexually Abused:     Family History  Problem Relation Age of Onset  . Hypertension Mother   . Heart attack Mother   . Cancer Maternal Aunt     ROS: no fevers or chills, productive cough, hemoptysis, dysphasia, odynophagia, melena, hematochezia, dysuria, hematuria, rash, seizure activity, orthopnea, PND, pedal edema, claudication. Remaining systems are negative.  Physical Exam: Well-developed well-nourished in no acute distress.  Skin is warm and dry.  HEENT is normal.  Neck is supple.  Chest is clear to auscultation with normal expansion.  Cardiovascular exam is regular rate and rhythm.  Abdominal exam nontender or distended. No masses palpated. Extremities show no edema. neuro grossly intact  ECG- personally reviewed  A/P  1 palpitations-  2 hypertension-patient's blood pressure is controlled.  Continue present medical regimen.  3 dyspnea on exertion-echocardiogram shows normal LV function.  Kirk Ruths, MD

## 2019-09-26 ENCOUNTER — Ambulatory Visit: Payer: BC Managed Care – PPO | Admitting: Cardiology

## 2019-11-11 ENCOUNTER — Emergency Department (INDEPENDENT_AMBULATORY_CARE_PROVIDER_SITE_OTHER): Payer: BC Managed Care – PPO

## 2019-11-11 ENCOUNTER — Encounter: Payer: Self-pay | Admitting: Emergency Medicine

## 2019-11-11 ENCOUNTER — Emergency Department
Admission: EM | Admit: 2019-11-11 | Discharge: 2019-11-11 | Disposition: A | Discharge status: Home / Self Care | Payer: BC Managed Care – PPO | Source: Home / Self Care

## 2019-11-11 ENCOUNTER — Other Ambulatory Visit: Payer: Self-pay

## 2019-11-11 DIAGNOSIS — M25552 Pain in left hip: Secondary | ICD-10-CM

## 2019-11-11 DIAGNOSIS — M79605 Pain in left leg: Secondary | ICD-10-CM | POA: Diagnosis not present

## 2019-11-11 DIAGNOSIS — M47816 Spondylosis without myelopathy or radiculopathy, lumbar region: Secondary | ICD-10-CM | POA: Diagnosis not present

## 2019-11-11 DIAGNOSIS — M1612 Unilateral primary osteoarthritis, left hip: Secondary | ICD-10-CM | POA: Diagnosis not present

## 2019-11-11 MED ORDER — PREDNISONE 50 MG PO TABS: 50.0000 mg | ORAL_TABLET | Freq: Every day | ORAL | 0 refills | Status: AC

## 2019-11-11 MED ORDER — METHYLPREDNISOLONE SODIUM SUCC 40 MG IJ SOLR
80.0000 mg | Freq: Once | INTRAMUSCULAR | Status: AC
  Administered 2019-11-11: 80 mg via INTRAMUSCULAR

## 2019-11-11 MED ORDER — MELOXICAM 7.5 MG PO TABS: 7.5000 mg | ORAL_TABLET | Freq: Every day | ORAL | 0 refills | Status: DC

## 2019-11-11 NOTE — ED Provider Notes (Signed)
[facility]    CSN: [id number] Arrival date & time: 11/11/19  2353      History   Chief Complaint Chief Complaint  Patient presents with  . Leg Pain    left    HPI Jean Yu is a 39 y.o. female.   HPI  Jean Yu is a 39 y.o. female presenting to UC with c/o 2 months of gradually worsening Left leg pain that is aching and burning at times. Associated mild Left lower back pain, moderate to severe Left hip pain, "it feels like bone is rubbing on bone in my hip."  She reports getting an ultrasound of her Left leg about 2-3 months ago but never received a report so she assumed everything was normal.  She took 500mg  acetaminophen yesterday with mild relief. She has tried a TENS unit and warm compresses w/o relief. No known injury. She does have hx of MS and has been off her medication for about 1-2 months but states her flare ups typically involve fatigue and headaches but no muscle pain leg she is experiencing in her leg.  Denies redness or swelling to her leg. No hx of blood clots.  Second Covid-19 vaccine ) received 08/28/19.   Past Medical History:  Diagnosis Date  . Borderline high cholesterol   . Hypertension   . MS (multiple sclerosis) (HCC) 12/09/2005  . Obesity   . OSA (obstructive sleep apnea)     Patient Active Problem List   Diagnosis Date Noted  . Left arm pain 05/11/2019  . Palpitations 05/11/2019  . Breast skin changes 05/11/2019  . Breast tenderness in female 05/11/2019  . Large breasts 08/29/2017  . Class 1 obesity due to excess calories with serious comorbidity and body mass index (BMI) of 34.0 to 34.9 in adult 08/29/2017  . Obstructive sleep apnea May 13, 2017  . Family history of sudden cardiac death in mother 13-May-2017  . Chest pain with low risk of acute coronary syndrome 05-13-2017  . BMI 33.0-33.9,adult 03/29/2017  . Hypertension 12/18/2008  . MS (multiple sclerosis) (HCC) 12/09/2005    Past Surgical  History:  Procedure Laterality Date  . No prior surgery      OB History   No obstetric history on file.      Home Medications    Prior to Admission medications   Medication Sig Start Date End Date Taking? Authorizing Provider  Ascorbic Acid (VITAMIN C) 1000 MG tablet Take 1,000 mg by mouth daily.   Yes [provider]  cholecalciferol (VITAMIN D) 1000 units tablet Take 1,000 Units by mouth daily.   Yes [provider]  metoprolol succinate (TOPROL-XL) 50 MG 24 hr tablet Take 1 tablet (50 mg total) by mouth daily. 05/08/19  Yes Breeback, [name] L, PA-C  triamterene-hydrochlorothiazide (DYAZIDE) 37.5-25 MG capsule Take 1 each (1 capsule total) by mouth daily. 05/08/19  Yes Breeback, [name] L, PA-C  vitamin B-12 (CYANOCOBALAMIN) 1000 MCG tablet Take by mouth.   Yes [provider]  Dimethyl Fumarate (TECFIDERA) 240 MG CPDR Take by mouth.    [provider]  ibuprofen (ADVIL) 800 MG tablet Take 1 tablet (800 mg total) by mouth every 8 (eight) hours as needed. 07/03/19   Breeback, 08/31/19, PA-C  meloxicam (MOBIC) 7.5 MG tablet Take 1-2 tablets (7.5-15 mg total) by mouth daily. 11/11/19   11/13/19, PA-C  predniSONE (DELTASONE) 50 MG tablet Take 1 tablet (50 mg total) by mouth daily with breakfast for 5 days. 11/11/19 11/16/19  Lurene Shadow, PA-C    Family History Family History  Problem Relation Age of Onset  . Hypertension Mother   . Heart attack Mother   . Cancer Maternal Aunt     Social History Social History   Tobacco Use  . Smoking status: Former Games developer  . Smokeless tobacco: Never Used  Substance Use Topics  . Alcohol use: Yes    Alcohol/week: 0.0 standard drinks    Comment: Rarely  . Drug use: No     Allergies   Patient has no known allergies.   Review of Systems Review of Systems  Constitutional: Negative for chills and fever.  Musculoskeletal: Positive for arthralgias and gait problem. Negative for joint swelling.  Skin:  Negative for color change and wound.     Physical Exam Triage Vital Signs ED Triage Vitals  Enc Vitals Group     BP 11/11/19 0933 (!) 134/92     Pulse Rate 11/11/19 0933 78     Resp 11/11/19 0933 16     Temp 11/11/19 0941 98.4 F (36.9 C)     Temp Source 11/11/19 0933 Tympanic     SpO2 11/11/19 0933 99 %     Weight --      Height --      Head Circumference --      Peak Flow --      Pain Score 11/11/19 0931 10     Pain Loc --      Pain Edu? --      Excl. in GC? --    No data found.  Updated Vital Signs BP (!) 134/92 (BP Location: Left Arm)   Pulse 78   Temp 98.4 F (36.9 C)   Resp 16   LMP 10/21/2019   SpO2 99%   Visual Acuity Right Eye Distance:   Left Eye Distance:   Bilateral Distance:    Right Eye Near:   Left Eye Near:    Bilateral Near:     Physical Exam Vitals and nursing note reviewed.  Constitutional:      Appearance: Normal appearance. She is well-developed.  HENT:     Head: Normocephalic and atraumatic.  Cardiovascular:     Rate and Rhythm: Normal rate.  Pulmonary:     Effort: Pulmonary effort is normal.  Musculoskeletal:        General: Tenderness present. Normal range of motion.     Cervical back: Normal range of motion.     Comments: Mild tenderness to left lower lumbar muscles. Tenderness to Left hip w/o obvious deformity or crepitus. Left thigh: tenderness to lateral and posterior aspect. Mild calf tenderness, muscle compartments are soft. No masses palpated. Full ROM hip, knee and ankle.   Skin:    General: Skin is warm and dry.     Capillary Refill: Capillary refill takes less than 2 seconds.     Findings: No erythema.  Neurological:     Mental Status: She is alert and oriented to person, place, and time.  Psychiatric:        Behavior: Behavior normal.      UC Treatments / Results  Labs (all labs ordered are listed, but only abnormal results are displayed) Labs Reviewed - No data to display  EKG   Radiology DG Lumbar  Spine Complete  Result Date: 11/11/2019 CLINICAL DATA:  Pt has been having pain in left hip that radiates down thigh to lower calf, also radiating to lower back on the left side. Pt denies injury or  any previous surgeries. The pain has become worse in last two weeks with no relief from OTC meds, and she is having a difficult time walking even short distances. EXAM: LUMBAR SPINE - COMPLETE 4+ VIEW COMPARISON:  01/25/2018 FINDINGS: Transitional lumbosacral vertebra, designated L5. Mild levoscoliosis. No fracture, bone lesion or spondylolisthesis. Discs are well maintained in height. Mild facet joint narrowing and sclerosis on the right at L4-L5 and L5-S1. Soft tissues are unremarkable. IMPRESSION: 1. No fracture or acute finding. 2. Minor degenerative changes and levoscoliosis as detailed. Stable appearance from the prior study. Electronically Signed   By: Amie Portland M.D.   On: 11/11/2019 11:02   DG Hip Unilat W or Wo Pelvis 2-3 Views Left  Result Date: 11/11/2019 CLINICAL DATA:  Pt has been having pain in left hip that radiates down thigh to lower calf, also radiating to lower back on the left side. Pt denies injury or any previous surgeries. The pain has become worse in last two weeks with no relief from OTC meds, and she is having a difficult time walking even short distances. EXAM: DG HIP (WITH OR WITHOUT PELVIS) 2-3V LEFT COMPARISON:  None. FINDINGS: No fracture or bone lesion. There is narrowing of the left hip joint space, most evident superior laterally. Right hip joint, SI joints and symphysis pubis are normally spaced and aligned. Bilateral coxa valga. Soft tissues are unremarkable. IMPRESSION: 1. No fracture, bone lesion or acute finding. 2. Degenerative changes of the left hip reflected by narrowing of the joint space, mostly superior laterally. No other joint abnormality. Bilateral coxa valga. Electronically Signed   By: Amie Portland M.D.   On: 11/11/2019 10:59    Procedures Procedures  (including critical care time)  Medications Ordered in UC Medications  methylPREDNISolone sodium succinate (SOLU-MEDROL) 40 mg/mL injection 80 mg (80 mg Intramuscular Given 11/11/19 1014)    Initial Impression / Assessment and Plan / UC Course  I have reviewed the triage vital signs and the nursing notes.  Pertinent labs & imaging results that were available during my care of the patient were reviewed by me and considered in my medical decision making (see chart for details).     Discussed imaging with pt Deformity likely causing pt's chronic Left leg pain. Pt has an appointment with PCP tomorrow, encouraged to keep appointment to discuss today's finding. She may need additional imaging or referral to help with pain. Pt verbalized understanding and agreement with plan.  AVS provided  Final Clinical Impressions(s) / UC Diagnoses   Final diagnoses:  Left hip pain  Left leg pain     Discharge Instructions      You were found to have a slight deformity of your hip joint. This is likely contributing to your worsening Left leg pain. It is recommended you follow up with Sports Medicine or an Orthopedist for further evaluation and treatment of your pain. Additional imaging such as an MRI may be indicated.  You may benefit from a referral to physical therapy depending on the specialist's findings.  You were given a shot of solumedrol (a steroid) today to help with inflammation in muscles to help with pain.  You have been prescribed 5 days of prednisone, an oral steroid.  You may start this medication tomorrow with breakfast.    Meloxicam (Mobic) is an antiinflammatory to help with pain and inflammation.  Do not take ibuprofen, Advil, Aleve, or any other medications that contain NSAIDs while taking meloxicam as this may cause stomach upset or even  ulcers if taken in large amounts for an extended period of time.      ED Prescriptions    Medication Sig Dispense Auth. Provider   meloxicam  (MOBIC) 7.5 MG tablet Take 1-2 tablets (7.5-15 mg total) by mouth daily. 30 tablet Gerarda Fraction, Erin O, PA-C   predniSONE (DELTASONE) 50 MG tablet Take 1 tablet (50 mg total) by mouth daily with breakfast for 5 days. 5 tablet Noe Gens, PA-C     PDMP not reviewed this encounter.   Noe Gens, [place] 11/11/19 [phone]

## 2019-11-11 NOTE — Discharge Instructions (Addendum)
  You were found to have a slight deformity of your hip joint. This is likely contributing to your worsening Left leg pain. It is recommended you follow up with Sports Medicine or an Orthopedist for further evaluation and treatment of your pain. Additional imaging such as an MRI may be indicated.  You may benefit from a referral to physical therapy depending on the specialist's findings.  You were given a shot of solumedrol (a steroid) today to help with inflammation in muscles to help with pain.  You have been prescribed 5 days of prednisone, an oral steroid.  You may start this medication tomorrow with breakfast.    Meloxicam (Mobic) is an antiinflammatory to help with pain and inflammation.  Do not take ibuprofen, Advil, Aleve, or any other medications that contain NSAIDs while taking meloxicam as this may cause stomach upset or even ulcers if taken in large amounts for an extended period of time.

## 2019-11-11 NOTE — Progress Notes (Signed)
Subjective:    CC: left leg pain and bruising  HPI: Very pleasant 39 year old female presenting today with complaints of continued left leg pain that extends into the upper left leg and into the hip joint.  Reports that she has been dealing with this pain for the last couple of months but this past week it started to get bad.  By Sunday the pain was intolerable and she was seen at urgent care.  They did an x-ray which showed bilateral coxa valga.  Patient notes an increase in overall activity and walking while at work.  Pain is described as "feeling like it was on fire".  She received a shot of Solu-Medrol yesterday and a 5-day burst of prednisone.  She was also provided with a prescription for meloxicam.  She reports the pain has resolved today and she is feeling so much better.  Is concerned that once the prednisone has been completed, the pain will come back.  Notes that she had bruising along the left lower leg and the left medial thigh in conjunction with the leg pain.  Denies injury/trauma to the area.  MS-has been seeing a neurologist in [facility] but reports communication difficulties with them.  Has been off of her Tecfidera for the last 1 to 2 months because of difficulties with communicating with the office as well as the new pharmacy.  Is requesting to have a referral to a new neurologist (preferably [facility] neurological Center in [place]).  I reviewed the past medical history, family history, social history, surgical history, and allergies today and no changes were needed.  Please see the problem list section below in epic for further details.  Past Medical History: Past Medical History:  Diagnosis Date  . Borderline high cholesterol   . Hypertension   . MS (multiple sclerosis) (HCC) 12/09/2005  . Obesity   . OSA (obstructive sleep apnea)    Past Surgical History: Past Surgical History:  Procedure Laterality Date  . No prior surgery     Social History: Social History    Socioeconomic History  . Marital status: Single    Spouse name: Not on file  . Number of children: Not on file  . Years of education: Not on file  . Highest education level: Not on file  Occupational History  . Not on file  Tobacco Use  . Smoking status: Former Games developer  . Smokeless tobacco: Never Used  Substance and Sexual Activity  . Alcohol use: Yes    Alcohol/week: 0.0 standard drinks    Comment: Rarely  . Drug use: No  . Sexual activity: Not Currently  Other Topics Concern  . Not on file  Social History Narrative  . Not on file   Social Determinants of Health   Financial Resource Strain:   . Difficulty of Paying Living Expenses:   Food Insecurity:   . Worried About Programme researcher, broadcasting/film/video in the Last Year:   . Barista in the Last Year:   Transportation Needs:   . Freight forwarder (Medical):   [name] Lack of Transportation (Non-Medical):   Physical Activity:   . Days of Exercise per Week:   . Minutes of Exercise per Session:   Stress:   . Feeling of Stress :   Social Connections:   . Frequency of Communication with Friends and Family:   . Frequency of Social Gatherings with Friends and Family:   . Attends Religious Services:   . Active Member of Clubs or Organizations:   .  Attends Archivist Meetings:   [name] Marital Status:    Family History: Family History  Problem Relation Age of Onset  . Hypertension Mother   . Heart attack Mother   . Cancer Maternal Aunt    Allergies: No Known Allergies Medications: See med rec.  Review of Systems: See HPI for pertinent positives and negatives.   Objective:    General: Well Developed, well nourished, and in no acute distress.  Neuro: Alert and oriented x3.  HEENT: Normocephalic, atraumatic.  Skin: Warm and dry. Cardiac: Regular rate and rhythm, no murmurs rubs or gallops, no lower extremity edema.  Respiratory: Clear to auscultation bilaterally. Not using accessory muscles, speaking in full  sentences.   Impression and Recommendations:    1. Left leg pain Symptoms currently under control with Solu-Medrol shot and prednisone burst.  Discussed using meloxicam after finishing prednisone.  Patient hesitant to take new medications and had several questions.  Once questions answered, she reported intending to start taking the meloxicam to see if this helps manage her pain.  Discussed potential physical therapy, patient amenable.  Referral entered. - Ambulatory referral to Physical Therapy  2. MS (multiple sclerosis) ([place]) Referral to [facility] neurological center in [place] per patient request. - Ambulatory referral to Neurology  Return if symptoms worsen or fail to improve. ___________________________________________ Clearnce Sorrel, DNP, APRN, FNP-BC [facility] [place]

## 2019-11-11 NOTE — ED Triage Notes (Addendum)
Pain to left leg  X 2 months - x-rays negative 2 months ago Aching & pressure to left calf & radiates up to thigh Denies left knee pain Tylenol 500mg  at 1300 yesterday No relief w/ TENS or warm compress Denies injury Pt has MS - denies flare up  Moderna Vaccine # 2 on 08/28/19 (flu & fatigue x 2 days)

## 2019-11-12 ENCOUNTER — Ambulatory Visit (INDEPENDENT_AMBULATORY_CARE_PROVIDER_SITE_OTHER): Payer: BC Managed Care – PPO | Admitting: Medical-Surgical

## 2019-11-12 ENCOUNTER — Encounter: Payer: Self-pay | Admitting: Medical-Surgical

## 2019-11-12 VITALS — BP 117/82 | HR 79 | Ht 71.0 in | Wt 251.6 lb

## 2019-11-12 DIAGNOSIS — G35 Multiple sclerosis: Secondary | ICD-10-CM

## 2019-11-12 DIAGNOSIS — M79605 Pain in left leg: Secondary | ICD-10-CM | POA: Diagnosis not present

## 2019-11-14 ENCOUNTER — Other Ambulatory Visit: Payer: Self-pay

## 2019-11-14 ENCOUNTER — Ambulatory Visit (INDEPENDENT_AMBULATORY_CARE_PROVIDER_SITE_OTHER): Payer: BC Managed Care – PPO | Admitting: Physical Therapy

## 2019-11-14 DIAGNOSIS — G4733 Obstructive sleep apnea (adult) (pediatric): Secondary | ICD-10-CM | POA: Diagnosis not present

## 2019-11-14 DIAGNOSIS — R2689 Other abnormalities of gait and mobility: Secondary | ICD-10-CM

## 2019-11-14 DIAGNOSIS — M6281 Muscle weakness (generalized): Secondary | ICD-10-CM | POA: Diagnosis not present

## 2019-11-14 DIAGNOSIS — M25552 Pain in left hip: Secondary | ICD-10-CM

## 2019-11-14 DIAGNOSIS — M25652 Stiffness of left hip, not elsewhere classified: Secondary | ICD-10-CM | POA: Diagnosis not present

## 2019-11-14 NOTE — Patient Instructions (Signed)
Access Code: [email or web address] URL: [email or web address] Date: 11/14/2019 Prepared by: Vernon Prey April Kirstie Peri  Exercises Supine Piriformis Stretch - 1 x daily - 7 x weekly - 2 sets - 2 reps - 30 hold Cobra - 1 x daily - 7 x weekly - 10 reps - 3 sets Seated Hip Flexor Stretch - 1 x daily - 7 x weekly - 10 reps - 3 sets

## 2019-11-14 NOTE — Therapy (Signed)
[facility] [address] [place], [place], [address] Phone: [phone]   Fax:  [phone]  Physical Therapy Evaluation  Patient Details  Name: Jean Yu MRN: [id number] Date of Birth: 1981-01-24 Referring Provider (PT): Christen Butter   Encounter Date: 11/14/2019  PT End of Session - 11/14/19 1008    Visit Number  1    Number of Visits  7   High co-pay. Discussed 1x/wk visit and tapering to every other week if doing well   Date for PT Re-Evaluation  02/06/20    PT Start Time  0848    PT Stop Time  0935    PT Time Calculation (min)  47 min    Activity Tolerance  Patient tolerated treatment well    Behavior During Therapy  [facility] for tasks assessed/performed       Past Medical History:  Diagnosis Date  . Borderline high cholesterol   . Hypertension   . MS (multiple sclerosis) (HCC) 12/09/2005  . Obesity   . OSA (obstructive sleep apnea)     Past Surgical History:  Procedure Laterality Date  . No prior surgery      There were no vitals filed for this visit.   Subjective Assessment - 11/14/19 0849    Subjective  Pt notes she has had a lot of pain in her hip. She states it feels like bone on bone and that it catches. She wears compression socks, wears good shoes (which helps) but she can still feel discomfort. She feels when she wears her belt for work her pelvis feels shifted. 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 She notes pain is in the L back and the hip. She states this past week it has been getting worse and therefore went to Urgent Care. She reports bruising comes and goes in left hip. She states Solumedrol helped her pain after seeing urgent care. She notes that with prednisone it has eased it but she still feels discomfort.    Limitations  Sitting;Standing;Walking;House hold activities    How long can you sit comfortably?  15 minutes    How long can you walk comfortably?  20  to 30 minutes    Currently in Pain?  Yes    Pain Score  2     Pain Location  Hip    Pain Orientation  Left    Pain Descriptors / Indicators  Discomfort;Nagging    Pain Type  Acute pain    Pain Radiating Towards  Low back    Pain Onset  In the past 7 days    Pain Frequency  Intermittent         [facility] PT Assessment - 11/14/19 0001      Assessment   Medical Diagnosis  M79.605 (ICD-10-CM) - Left leg pain    Referring Provider (PT)  Larinda Buttery, Joy    Prior Therapy  None      Balance Screen   Has the patient fallen in the past 6 months  No    Has the patient had a decrease in activity level because of a fear of falling?   Yes      Home Environment   Living Environment  Private residence    Living Arrangements  Children    Available Help at Discharge  Family    Type of Home  House    Home Access  Stairs to enter    Entrance Stairs-Number of Steps  4  Home Layout  One level      Prior Function   Level of Independence  Independent    Vocation  Full time employment      Observation/Other Assessments   Observations  R iliac crest higher than L in standing    Focus on Therapeutic Outcomes (FOTO)   49%      ROM / Strength   AROM / PROM / Strength  AROM;Strength      AROM   Left Hip Extension  30    Left Hip Flexion  125   Hip hike, adduct and internally rotates to come into flexion   Left Hip External Rotation   15    Left Hip Internal Rotation   --   WFL   Left Hip ABduction  30    Left Hip ADduction  --   [facility]   Lumbar Flexion  WFL    Lumbar Extension  WFL    Lumbar - Right Side Bend  ~25%    Lumbar - Left Side Bend  ~50%   Limited due to pain   Lumbar - Right Rotation  WFL    Lumbar - Left Rotation  [facility]      Strength   Left Hip Flexion  3+/5   Limited due to pain   Left Hip Extension  4/5    Left Hip External Rotation  4/5   Limited due to pain   Left Hip Internal Rotation  5/5    Left Hip ABduction  3+/5      Flexibility   Piriformis  tight Lt > Rt      Quadratus Lumborum  Multiple trigger points along quadratus lumborum; shortened on L vs R      Palpation   SI assessment   Elevated R iliac crest with R anterior nutation in standing; posterior pelvic rotation on L in prone      Special Tests   Other special tests  Supine to long sit; R>L anterior inominate rotation       Sacral thrust    Findings  Negative    Comments  Feels good to pt      Gaenslen's test   Findings  Negative      Sacral Compression   Findings  Negative    Comments  Feels good for pt      Luisa Hart (FABER) Test   Findings  Positive      Trendelenburg Test   Findings  Positive    Side  Left      Hip Scouring   Findings  Positive    Side  Left      Ambulation/Gait   Ambulation Distance (Feet)  50 Feet    Assistive device  None    Gait Pattern  Step-through pattern;Right circumduction;Decreased weight shift to left;Trendelenburg                  Objective measurements completed on examination: See above findings.      [facility] Adult PT Treatment/Exercise - 11/14/19 0001      Lumbar Exercises: Stretches   Hip Flexor Stretch  30 seconds;Left    Press Ups  2 reps;30 seconds    Piriformis Stretch  2 reps;30 seconds;Right;Left      Knee/Hip Exercises: Stretches   Hip Flexor Stretch  Left;2 reps;30 seconds   In supine     Manual Therapy   Joint Mobilization  L hip distraction with no relief; sacral grade II mobilization with great relief  Soft tissue mobilization  L quadratus lumborum with multiple trigger points             PT Education - 11/14/19 1343    Education Details  Discussed stretching structures in hip and pelvis to decrease pain; educated pt on using tennis ball for her trigger points.    Person(s) Educated  Patient    Methods  Explanation;Demonstration;Handout    Comprehension  Verbalized understanding;Returned demonstration;Tactile cues required       PT Short Term Goals - 11/14/19 1420      PT SHORT TERM GOAL  #1   Title  Pt will be independent with HEP    Time  3    Period  Weeks    Status  New    Target Date  12/05/19        PT Long Term Goals - 11/14/19 1421      PT LONG TERM GOAL #1   Title  Pt will improve L hip strength to 5/5 in all directions for improved muscle stability with home and work tasks    Baseline  Hip flexion and abduction 3+/5    Time  6    Period  Weeks    Status  New    Target Date  12/26/19      PT LONG TERM GOAL #2   Title  Pt will improve L hip external rotation to [facility] for improved overall hip ROM    Time  6    Period  Weeks    Status  New    Target Date  12/26/19      PT LONG TERM GOAL #3   Title  Pt will be able to tolerate ambulating and standing >30 min for work    Baseline  Can only walk for 20-30 minutes before pain    Time  6    Period  Weeks    Status  New    Target Date  12/26/19      PT LONG TERM GOAL #4   Title  Pt will have improved gait pattern with decreased hip compensation    Baseline  Demonstrates L lateral lean/trendelenburg with weight bearing, antalgic,    Time  6    Period  Weeks    Status  New    Target Date  12/26/19      PT LONG TERM GOAL #5   Title  Improve FOTO to 34% limitation    Baseline  49% limitation    Time  6    Period  Weeks    Status  New    Target Date  12/26/19             Plan - 11/14/19 1331    Clinical Impression Statement  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 Pt presents with decreased L hip ROM, strength, shortened quadratus lumborum and L>R posterior pelvic tilt affecting standing and ambulatory tasks for home and work. Pt would benefit from therapy to address these issues for optimized level of function. Pt discomfort and pain alleviated to 0/10 at rest after performing stretching and manual therapy, 1/10 with dynamic movement. PT visits limited due to pt with high co-pay -- discussed 1x/wk to start. May benefit from telehealth.    Personal  Factors and Comorbidities  Comorbidity 1;Finances    Examination-Activity Limitations  Bend;Stand;Locomotion Level    Examination-Participation Restrictions  Community Activity;Other    Stability/Clinical Decision Making  Stable/Uncomplicated    Clinical Decision Making  Low    Rehab Potential  Good    PT Frequency  1x / week    PT Duration  6 weeks    PT Treatment/Interventions  ADLs/Self Care Home Management;Electrical Stimulation;Cryotherapy;Iontophoresis 4mg /ml Dexamethasone;Moist Heat;Traction;Gait training;Stair training;Functional mobility training;Therapeutic activities;Therapeutic exercise;Balance training;Neuromuscular re-education;Patient/family education;Manual techniques;Passive range of motion;Dry needling;Taping;Spinal Manipulations;Joint Manipulations    PT Next Visit Plan  Manual therapy and stretching as needed. Begin core and hip strengthening exercises. Consider gait training.    PT Home Exercise Plan  [id number]    Consulted and Agree with Plan of Care  Patient       Patient will benefit from skilled therapeutic intervention in order to improve the following deficits and impairments:  Abnormal gait, Decreased range of motion, Difficulty walking, Decreased endurance, Decreased activity tolerance, Pain, Hypomobility, Impaired flexibility, Decreased mobility, Decreased strength, Postural dysfunction  Visit Diagnosis: Muscle weakness (generalized)  Pain in left hip  Stiffness of left hip, not elsewhere classified  Other abnormalities of gait and mobility     Problem List Patient Active Problem List   Diagnosis Date Noted  . Left arm pain 05/11/2019  . Palpitations 05/11/2019  . Breast skin changes 05/11/2019  . Breast tenderness in female 05/11/2019  . Large breasts 08/29/2017  . Class 1 obesity due to excess calories with serious comorbidity and body mass index (BMI) of 34.0 to 34.9 in adult 08/29/2017  . Obstructive sleep apnea May 18, 2017  . Family history of  sudden cardiac death in mother 2017/05/18  . Chest pain with low risk of acute coronary syndrome May 18, 2017  . BMI 33.0-33.9,adult 03/29/2017  . Hypertension 12/18/2008  . MS (multiple sclerosis) ([place]) 12/09/2005    Maryori Weide April Ma L Sparrow Bush PT, DPT 11/14/2019, 2:38 PM  [facility] [place], [place], [address] Phone: [phone]   Fax:  [phone]  Name: Jean Yu MRN: [id number] Date of Birth: [birth year]

## 2019-11-20 ENCOUNTER — Other Ambulatory Visit: Payer: Self-pay

## 2019-11-20 ENCOUNTER — Ambulatory Visit: Payer: BC Managed Care – PPO | Admitting: Physical Therapy

## 2019-11-20 ENCOUNTER — Encounter: Payer: Self-pay | Admitting: Physical Therapy

## 2019-11-20 ENCOUNTER — Telehealth: Payer: Self-pay

## 2019-11-20 DIAGNOSIS — M25652 Stiffness of left hip, not elsewhere classified: Secondary | ICD-10-CM | POA: Diagnosis not present

## 2019-11-20 DIAGNOSIS — M25552 Pain in left hip: Secondary | ICD-10-CM

## 2019-11-20 DIAGNOSIS — M6281 Muscle weakness (generalized): Secondary | ICD-10-CM

## 2019-11-20 DIAGNOSIS — R2689 Other abnormalities of gait and mobility: Secondary | ICD-10-CM

## 2019-11-20 DIAGNOSIS — G35 Multiple sclerosis: Secondary | ICD-10-CM

## 2019-11-20 NOTE — Therapy (Addendum)
[place], [place], [address] Phone: [phone]   Fax:  [phone]  Physical Therapy Treatment and Discharge  Patient Details  Name: Jean Yu MRN: [id number] Date of Birth: 02-25-1981 Referring Provider (PT): Samuel Bouche   Encounter Date: 11/20/2019  PT End of Session - 11/20/19 1015    Visit Number  2    Number of Visits  7   High co-pay. Discussed 1x/wk visit and tapering to every other week if doing well   Date for PT Re-Evaluation  02/06/20    PT Start Time  1016    PT Stop Time  1106    PT Time Calculation (min)  50 min    Activity Tolerance  Patient tolerated treatment well    Behavior During Therapy  [facility] for tasks assessed/performed       Past Medical History:  Diagnosis Date  . Borderline high cholesterol   . Hypertension   . MS (multiple sclerosis) ([place]) 12/09/2005  . Obesity   . OSA (obstructive sleep apnea)     Past Surgical History:  Procedure Laterality Date  . No prior surgery      There were no vitals filed for this visit.  Subjective Assessment - 11/20/19 1022    Subjective  Pt reports her pain has increased pain since last visit.  She has done the stretches on her days off (Thurs, and yesterday 2x).  She states she walks a lot during day, up to 5000 steps.    Currently in Pain?  Yes    Pain Score  5     Pain Location  Hip    Pain Orientation  Left;Anterior    Pain Descriptors / Indicators  Constant    Aggravating Factors   ?    Pain Relieving Factors  ?         [facility] PT Assessment - 11/20/19 0001      Assessment   Medical Diagnosis  M79.605 (ICD-10-CM) - Left leg pain    Referring Provider (PT)  Charna Archer, Joy    Prior Therapy  None       [facility] Adult PT Treatment/Exercise - 11/20/19 0001      Self-Care   Self-Care  Other Self-Care Comments    Other Self-Care Comments   Pt educated on self massage with ball to Lt psoas in prone (4 min MFR),  and to posterior hip/LB musculature in standing. Pt returned demo with cues.  Pt educated on musculature of low back and pelvis and how it is affected with posture; pictures shown to aid in comprehension      Lumbar Exercises: Stretches   Hip Flexor Stretch  Left;2 reps;20 seconds   seated; 1 rep with arm overhead   Standing Extension  1 rep;5 seconds    Prone on Elbows Stretch  2 reps;60 seconds    Press Ups  5 reps;5 seconds    Quad Stretch  Left;2 reps;20 seconds    Piriformis Stretch  Left;2 reps;20 seconds;Right;1 rep   seated   Figure 4 Stretch  2 reps;20 seconds   supine     Lumbar Exercises: Aerobic   Nustep  L4-3: 5 min for warm up.       Modalities   Modalities  Electrical Stimulation;Moist Heat      Moist Heat Therapy   Number Minutes Moist Heat  10 Minutes    Moist Heat Location  Lumbar Spine      Electrical Stimulation  Electrical Stimulation Location  Lt glute/ Lt low back     Electrical Stimulation Action  premod to each area    Electrical Stimulation Parameters  intensity to tolerance; 10 min     Electrical Stimulation Goals  Pain             PT Education - 11/20/19 1259    Education Details  education on pelvic/low back musculature;  self massage; HEP    Person(s) Educated  Patient    Methods  Explanation;Handout;Verbal cues;Tactile cues;Demonstration    Comprehension  Verbalized understanding;Returned demonstration       PT Short Term Goals - 11/14/19 1420      PT SHORT TERM GOAL #1   Title  Pt will be independent with HEP    Time  3    Period  Weeks    Status  New    Target Date  12/05/19        PT Long Term Goals - 11/14/19 1421      PT LONG TERM GOAL #1   Title  Pt will improve L hip strength to 5/5 in all directions for improved muscle stability with home and work tasks    Baseline  Hip flexion and abduction 3+/5    Time  6    Period  Weeks    Status  New    Target Date  12/26/19      PT LONG TERM GOAL #2   Title  Pt will  improve L hip external rotation to [facility] for improved overall hip ROM    Time  6    Period  Weeks    Status  New    Target Date  12/26/19      PT LONG TERM GOAL #3   Title  Pt will be able to tolerate ambulating and standing >30 min for work    Baseline  Can only walk for 20-30 minutes before pain    Time  6    Period  Weeks    Status  New    Target Date  12/26/19      PT LONG TERM GOAL #4   Title  Pt will have improved gait pattern with decreased hip compensation    Baseline  Demonstrates L lateral lean/trendelenburg with weight bearing, antalgic,    Time  6    Period  Weeks    Status  New    Target Date  12/26/19      PT LONG TERM GOAL #5   Title  Improve FOTO to 34% limitation    Baseline  49% limitation    Time  6    Period  Weeks    Status  New    Target Date  12/26/19            Plan - 11/20/19 1256    Clinical Impression Statement  Positive response to ball release work in Christmas psoas and posterior hip/low back musculature.  Significant reduction of pain reported at end of session.  HEP modified to tolerance.  Goals are ongoing.    Personal Factors and Comorbidities  Comorbidity 1;Finances    Examination-Activity Limitations  Bend;Stand;Locomotion Level    Examination-Participation Restrictions  Community Activity;Other    Stability/Clinical Decision Making  Stable/Uncomplicated    Rehab Potential  Good    PT Frequency  1x / week    PT Duration  6 weeks    PT Treatment/Interventions  ADLs/Self Care Home Management;Electrical Stimulation;Cryotherapy;Iontophoresis 48m/ml Dexamethasone;Moist Heat;Traction;Gait training;Stair training;Functional mobility  training;Therapeutic activities;Therapeutic exercise;Balance training;Neuromuscular re-education;Patient/family education;Manual techniques;Passive range of motion;Dry needling;Taping;Spinal Manipulations;Joint Manipulations    PT Next Visit Plan  Manual therapy and stretching as needed. Begin core and hip strengthening  exercises. Consider gait training.    PT Home Exercise Plan  [id number]    Consulted and Agree with Plan of Care  Patient       Patient will benefit from skilled therapeutic intervention in order to improve the following deficits and impairments:  Abnormal gait, Decreased range of motion, Difficulty walking, Decreased endurance, Decreased activity tolerance, Pain, Hypomobility, Impaired flexibility, Decreased mobility, Decreased strength, Postural dysfunction  PHYSICAL THERAPY DISCHARGE SUMMARY  Visits from Start of Care: 2  Current functional level related to goals / functional outcomes: Not met. Not seen since 5/25.    Remaining deficits: See treatment note above   Education / Equipment: Pt last educated on performing self trigger point release/massage and hip/pelvis anatomy  Plan:                                                    Patient goals were not met. Patient is being discharged due to not returning since the last visit.  ?????       Visit Diagnosis: Pain in left hip  Muscle weakness (generalized)  Stiffness of left hip, not elsewhere classified  Other abnormalities of gait and mobility     Problem List Patient Active Problem List   Diagnosis Date Noted  . Left arm pain 05/11/2019  . Palpitations 05/11/2019  . Breast skin changes 05/11/2019  . Breast tenderness in female 05/11/2019  . Large breasts 08/29/2017  . Class 1 obesity due to excess calories with serious comorbidity and body mass index (BMI) of 34.0 to 34.9 in adult 08/29/2017  . Obstructive sleep apnea 05/06/2017  . Family history of sudden cardiac death in mother 2017-05-06  . Chest pain with low risk of acute coronary syndrome May 06, 2017  . BMI 33.0-33.9,adult 03/29/2017  . Hypertension 12/18/2008  . MS (multiple sclerosis) ([place]) 12/09/2005    Gellen April Gordy Levan PT, DPT 01/09/2020 3:57 PM  Kerin Perna, PTA 11/20/19 1:09 PM  [place], [place], [address] Phone: [phone]   Fax:  [phone]  Name: Riddhi Grether MRN: [id number] Date of Birth: 11-30-1980

## 2019-11-20 NOTE — Telephone Encounter (Signed)
I am so very sorry for the horrible experience. I have reentered a referral to Neurology to see a Dr. Burnett Corrente. He is located in [place] near the [address] and [address] on [address]. If you have any difficulty with getting his office or being seen there, please let us know. There are a couple of other Neurology offices that we can try but Dr. Loleta Chance was recommended by Lesly Rubenstein so that's the one I went with. If there is somewhere else you would like to go, I will be glad to fix the referral as quickly as possible.

## 2019-11-20 NOTE — Telephone Encounter (Signed)
Forgot to route

## 2019-11-20 NOTE — Telephone Encounter (Signed)
Pt was referred to Neuro by Joy for MS. She was scheduled with [facility] for today. She states she went for her OV and checked in, paid her copay, then sat and waited for an hour and a half before leaving without being seen. She states that the staff was not very nice to her so she is requesting a new referral to see a Neurologist in the [place] or [place] area, just not [employer]. A referral has been tee'd up below for review and approval or denial.

## 2019-11-21 NOTE — Telephone Encounter (Signed)
Patient aware via voicemail that a new referral has been entered. Verbal conversation was had between [name] and Jean Yu and she will not be referred to [facility] because that was where she was referred to and had this bad experience. She will be referred to [facility] in [place]. Instructed patient to call back if there are any questions or concerns.

## 2019-11-28 ENCOUNTER — Encounter: Payer: BC Managed Care – PPO | Admitting: Physical Therapy

## 2019-12-07 ENCOUNTER — Encounter: Payer: BC Managed Care – PPO | Admitting: Physical Therapy

## 2019-12-23 ENCOUNTER — Other Ambulatory Visit: Payer: Self-pay | Admitting: Physician Assistant

## 2019-12-23 DIAGNOSIS — I10 Essential (primary) hypertension: Secondary | ICD-10-CM

## 2019-12-27 ENCOUNTER — Ambulatory Visit (INDEPENDENT_AMBULATORY_CARE_PROVIDER_SITE_OTHER): Payer: Managed Care, Other (non HMO) | Admitting: Nurse Practitioner

## 2019-12-27 ENCOUNTER — Encounter: Payer: Self-pay | Admitting: Nurse Practitioner

## 2019-12-27 VITALS — BP 129/90 | HR 89 | Temp 98.4°F | Ht 71.0 in | Wt 247.3 lb

## 2019-12-27 DIAGNOSIS — H109 Unspecified conjunctivitis: Secondary | ICD-10-CM

## 2019-12-27 DIAGNOSIS — M7062 Trochanteric bursitis, left hip: Secondary | ICD-10-CM | POA: Diagnosis not present

## 2019-12-27 MED ORDER — OFLOXACIN 0.3 % OP SOLN: 2.0000 [drp] | Freq: Four times a day (QID) | OPHTHALMIC | 0 refills | Status: AC

## 2019-12-27 MED ORDER — MELOXICAM 7.5 MG PO TABS: 7.5000 mg | ORAL_TABLET | Freq: Every day | ORAL | 3 refills | Status: DC

## 2019-12-27 NOTE — Patient Instructions (Signed)

## 2019-12-27 NOTE — Progress Notes (Signed)
Acute Office Visit  Subjective:    Patient ID: Jean Yu, female    DOB: [birth year], 39 y.o.   MRN: [id number]  Chief Complaint  Patient presents with  . Eye Problem    right eye, red, swollen, wears contacts, woke up with this yesterday morning and has not worn contacts since then, green mucus draining  . Hip Pain    chronic hip pain, requesting refill of meloxicam    HPI Patient is in today for right eye pain, redness, and green discharge that started yesterday evening. She reports when she woke up this morning, her she had a film of green mucous over the entire eye.   She denies itching or burning of the eye, sinus pain or pressure, rhinorrhea, fever, chills, headache, recent illness, or exposure to anyone with known upper respiratory illness or conjunctivitis. She has no vision changes.   She is a contact lens wearer, but has not put her contacts in today.   LEFT HIP PAIN She reports she was seen in the urgent care for left hip trochanter bursitis and prescribed meloxicam daily. She reports the medication has helped her symptoms significantly. She recently ran out of medication and the pain has returned. 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 She is requesting a refill today.    Past Medical History:  Diagnosis Date  . Borderline high cholesterol   . Hypertension   . MS (multiple sclerosis) (HCC) 12/09/2005  . Obesity   . OSA (obstructive sleep apnea)     Past Surgical History:  Procedure Laterality Date  . No prior surgery      Family History  Problem Relation Age of Onset  . Hypertension Mother   . Heart attack Mother   . Cancer Maternal Aunt     Social History   Socioeconomic History  . Marital status: Single    Spouse name: Not on file  . Number of children: Not on file  . Years of  education: Not on file  . Highest education level: Not on file  Occupational History  . Not on file  Tobacco Use  . Smoking status: Former Games developer  . Smokeless tobacco: Never Used  Vaping Use  . Vaping Use: Never used  Substance and Sexual Activity  . Alcohol use: Yes    Alcohol/week: 0.0 standard drinks    Comment: Rarely  . Drug use: No  . Sexual activity: Not Currently  Other Topics Concern  . Not on file  Social History Narrative  . Not on file   Social Determinants of Health   Financial Resource Strain:   . Difficulty of Paying Living Expenses:   Food Insecurity:   . Worried About Programme researcher, broadcasting/film/video in the Last Year:   . Barista in the Last Year:   Transportation Needs:   . Freight forwarder (Medical):   [name] Lack of Transportation (Non-Medical):   Physical Activity:   . Days of Exercise per Week:   . Minutes of Exercise per Session:   Stress:   . Feeling of Stress :   Social Connections:   . Frequency of Communication with Friends and Family:   . Frequency of Social Gatherings with Friends and Family:   . Attends Religious Services:   . Active Member of Clubs or Organizations:   .  Attends Banker Meetings:   [name] Marital Status:   Intimate Partner Violence:   . Fear of Current or Ex-Partner:   . Emotionally Abused:   [name] Physically Abused:   . Sexually Abused:     Outpatient Medications Prior to Visit  Medication Sig Dispense Refill  . Ascorbic Acid (VITAMIN C) 1000 MG tablet Take 1,000 mg by mouth daily.    . cholecalciferol (VITAMIN D) 1000 units tablet Take 1,000 Units by mouth daily.    . metoprolol succinate (TOPROL-XL) 50 MG 24 hr tablet Take 1 tablet (50 mg total) by mouth daily. Needs labs 30 tablet 0  . triamterene-hydrochlorothiazide (DYAZIDE) 37.5-25 MG capsule Take 1 each (1 capsule total) by mouth daily. Needs labs 30 capsule 0  . vitamin B-12 (CYANOCOBALAMIN) 1000 MCG tablet Take by mouth.    . Dimethyl Fumarate (TECFIDERA)  240 MG CPDR Take by mouth. (Patient not taking: Reported on 12/27/2019)    . meloxicam (MOBIC) 7.5 MG tablet Take 1-2 tablets (7.5-15 mg total) by mouth daily. (Patient not taking: Reported on 12/27/2019) 30 tablet 0   No facility-administered medications prior to visit.    No Known Allergies      Objective:    Physical Exam Vitals and nursing note reviewed.  Constitutional:      Appearance: Normal appearance.  HENT:     Head: Normocephalic.     Nose: Nose normal. No congestion or rhinorrhea.     Mouth/Throat:     Mouth: Mucous membranes are moist.     Pharynx: Oropharynx is clear.  Eyes:     General: Scleral icterus present.        Right eye: Discharge present.     Extraocular Movements: Extraocular movements intact.     Pupils: Pupils are equal, round, and reactive to light.  Cardiovascular:     Rate and Rhythm: Normal rate and regular rhythm.     Pulses: Normal pulses.  Pulmonary:     Effort: Pulmonary effort is normal.     Breath sounds: Normal breath sounds.  Musculoskeletal:     Cervical back: Normal range of motion.  Lymphadenopathy:     Cervical: No cervical adenopathy.  Skin:    General: Skin is warm and dry.     Capillary Refill: Capillary refill takes less than 2 seconds.  Neurological:     General: No focal deficit present.     Mental Status: She is alert and oriented to person, place, and time.  Psychiatric:        Mood and Affect: Mood normal.        Behavior: Behavior normal.        Thought Content: Thought content normal.        Judgment: Judgment normal.     BP 129/90   Pulse 89   Temp 98.4 F (36.9 C) (Oral)   Ht 5\' 11"  (1.803 m)   Wt 247 lb 4.8 oz (112.2 kg)   SpO2 98%   BMI 34.49 kg/m  Wt Readings from Last 3 Encounters:  12/27/19 247 lb 4.8 oz (112.2 kg)  11/12/19 251 lb 9.6 oz (114.1 kg)  07/11/19 247 lb (112 kg)    There are no preventive care reminders to display for this patient.  There are no preventive care reminders to  display for this patient.   Lab Results  Component Value Date   TSH 0.58 05/08/2018   Lab Results  Component Value Date   WBC 2.6 (L) 08/29/2017  HGB 12.1 08/29/2017   HCT 35.5 08/29/2017   MCV 83.5 08/29/2017   PLT 173 08/29/2017   Lab Results  Component Value Date   NA 139 05/08/2018   K 4.1 05/08/2018   CO2 28 05/08/2018   GLUCOSE 81 05/08/2018   BUN 14 05/08/2018   CREATININE 0.73 05/08/2018   BILITOT 0.8 05/08/2018   AST 11 05/08/2018   ALT 11 05/08/2018   PROT 7.1 05/08/2018   CALCIUM 9.2 05/08/2018   Lab Results  Component Value Date   CHOL 207 (H) 05/08/2018   Lab Results  Component Value Date   HDL 76 05/08/2018   Lab Results  Component Value Date   LDLCALC 117 (H) 05/08/2018   Lab Results  Component Value Date   TRIG 52 05/08/2018   Lab Results  Component Value Date   CHOLHDL 2.7 05/08/2018   No results found for: HGBA1C     Assessment & Plan:  1. Bacterial conjunctivitis of right eye Symptoms and presentation consistent with acute bacterial conjunctivitis of the right eye. Disucssed hygiene measures to prevent spreading the infection to the other eye or other people. Discussed not wearing contact lenses until the infection has resolved and antibiotic therapy is complete. A new pair of contact lenses should be used to prevent re-infection. Treat with ofloxacin eye drops 4 times a day for 5 days.  Follow-up if symptoms worsen or fail to improve.  - ofloxacin (OCUFLOX) 0.3 % ophthalmic solution; Place 2 drops into the right eye 4 (four) times daily for 5 days.  Dispense: 5 mL; Refill: 0  2. Trochanteric bursitis of left hip History of trochanteric bursitis of the left hip treated with daily meloxicam. Symptoms flaired after running out of medication. Medication refill provided today.  - meloxicam (MOBIC) 7.5 MG tablet; Take 1-2 tablets (7.5-15 mg total) by mouth daily.  Dispense: 60 tablet; Refill: 3   Tollie Eth, NP

## 2020-01-09 ENCOUNTER — Telehealth: Payer: Self-pay | Admitting: Neurology

## 2020-01-09 DIAGNOSIS — M1612 Unilateral primary osteoarthritis, left hip: Secondary | ICD-10-CM

## 2020-01-09 NOTE — Telephone Encounter (Signed)
Patient left vm stating she is taking Meloxicam 7.5 mg - two tablets a day and it is not helping the hip pain. She wants to know if there is a stronger dose or something else she could try? Please advise.

## 2020-01-09 NOTE — Telephone Encounter (Signed)
I can give you some tramadol to help pain but not going to do anything for the arthritis that is causing the pain. I think you need to see orthopedist to consider injection in hip. Thoughts?

## 2020-01-09 NOTE — Telephone Encounter (Signed)
Patient declined tramadol. She doesn't want to take pain medications if she can help it. She was okay with referral to orthopedics. Referral made.

## 2020-01-15 ENCOUNTER — Ambulatory Visit: Payer: Managed Care, Other (non HMO) | Admitting: Physician Assistant

## 2020-01-23 ENCOUNTER — Ambulatory Visit: Payer: Managed Care, Other (non HMO) | Admitting: Orthopaedic Surgery

## 2020-01-30 ENCOUNTER — Other Ambulatory Visit: Payer: Self-pay | Admitting: Physician Assistant

## 2020-01-30 DIAGNOSIS — I10 Essential (primary) hypertension: Secondary | ICD-10-CM

## 2020-02-06 ENCOUNTER — Ambulatory Visit: Payer: Managed Care, Other (non HMO) | Admitting: Orthopaedic Surgery

## 2020-02-06 ENCOUNTER — Encounter: Payer: Self-pay | Admitting: Orthopaedic Surgery

## 2020-02-06 DIAGNOSIS — M1612 Unilateral primary osteoarthritis, left hip: Secondary | ICD-10-CM

## 2020-02-06 NOTE — Progress Notes (Signed)
Office Visit Note   Patient: Jean Yu           Date of Birth: Dec 28, 1980           MRN: [id number] Visit Date: 02/06/2020              Requested by: [name], PA-C [address] [place],  [place] [address] PCP: [name], PA-C   Assessment & Plan: Visit Diagnoses:  1. Primary osteoarthritis of left hip     Plan: Impression is left hip moderate degenerative joint disease and probable labral tear and underlying left lower extremity radiculopathy. Due to the patient feeling much better on steroids today, I do not feel it is appropriate to refer her to Dr. Prince Rome for an ultrasound-guided cortisone injection to the left hip joint. She will follow up with him once her symptoms return. She will follow up with [place] as needed.  Follow-Up Instructions: Return if symptoms worsen or fail to improve.   Orders:  No orders of the defined types were placed in this encounter.  No orders of the defined types were placed in this encounter.     Procedures: No procedures performed   Clinical Data: No additional findings.   Subjective: Chief Complaint  Patient presents with  . Left Hip - Pain    HPI patient is a pleasant 39 year old who is also comes in today with left hip pain.  This began approximately 1 year without a specific injury.  Her pain has recently worsened.  The pain she has is primarily to the groin and top of the thigh but does note occasional pain down the back of the leg and foot.  She describes this as a constant ache with associated catching.  Lying on her left side as well as lifting her leg seems to worsen her symptoms. She has been taking meloxicam without significant relief of symptoms. She was put on prednisone yesterday which seems to significantly help. She has recently noticed burning down her entire leg as well. No bowel or bladder change and no saddle paresthesias.  Review of Systems as detailed in HPI. All others  reviewed and are negative.   Objective: Vital Signs: There were no vitals taken for this visit.  Physical Exam well-developed well-nourished female no acute distress. Alert and oriented x3.  Ortho Exam examination of her left hip reveals a negative logroll, negative FADIR negative straight leg raise, (she does note all of these movements would typically hurt but feels much better today as she is on prednisone)  Specialty Comments:  No specialty comments available.  Imaging: No new imaging   PMFS History: Patient Active Problem List   Diagnosis Date Noted  . Arthritis of left hip 01/09/2020  . Left arm pain 05/11/2019  . Palpitations 05/11/2019  . Breast skin changes 05/11/2019  . Breast tenderness in female 05/11/2019  . Large breasts 08/29/2017  . Class 1 obesity due to excess calories with serious comorbidity and body mass index (BMI) of 34.0 to 34.9 in adult 08/29/2017  . Obstructive sleep apnea 05/11/17  . Family history of sudden cardiac death in mother 05-11-17  . Chest pain with low risk of acute coronary syndrome 05/11/17  . BMI 33.0-33.9,adult 03/29/2017  . Hypertension 12/18/2008  . MS (multiple sclerosis) (HCC) 12/09/2005   Past Medical History:  Diagnosis Date  . Borderline high cholesterol   . Hypertension   . MS (multiple sclerosis) (HCC) 12/09/2005  . Obesity   .  OSA (obstructive sleep apnea)     Family History  Problem Relation Age of Onset  . Hypertension Mother   . Heart attack Mother   . Cancer Maternal Aunt     Past Surgical History:  Procedure Laterality Date  . No prior surgery     Social History   Occupational History  . Not on file  Tobacco Use  . Smoking status: Former Games developer  . Smokeless tobacco: Never Used  Vaping Use  . Vaping Use: Never used  Substance and Sexual Activity  . Alcohol use: Yes    Alcohol/week: 0.0 standard drinks    Comment: Rarely  . Drug use: No  . Sexual activity: Not Currently

## 2020-02-20 ENCOUNTER — Encounter: Payer: Self-pay | Admitting: Family Medicine

## 2020-02-20 ENCOUNTER — Ambulatory Visit: Payer: Self-pay

## 2020-02-20 ENCOUNTER — Other Ambulatory Visit: Payer: Self-pay

## 2020-02-20 ENCOUNTER — Ambulatory Visit (INDEPENDENT_AMBULATORY_CARE_PROVIDER_SITE_OTHER): Payer: Managed Care, Other (non HMO) | Admitting: Family Medicine

## 2020-02-20 DIAGNOSIS — F430 Acute stress reaction: Secondary | ICD-10-CM | POA: Insufficient documentation

## 2020-02-20 DIAGNOSIS — M1612 Unilateral primary osteoarthritis, left hip: Secondary | ICD-10-CM

## 2020-02-20 DIAGNOSIS — B009 Herpesviral infection, unspecified: Secondary | ICD-10-CM | POA: Insufficient documentation

## 2020-02-20 DIAGNOSIS — I493 Ventricular premature depolarization: Secondary | ICD-10-CM | POA: Insufficient documentation

## 2020-02-20 NOTE — Progress Notes (Signed)
Subjective: Patient is here for ultrasound-guided intra-articular left hip injection.   Groin pain with early DJD on x-ray.  Objective:  Pain with passive IR.  Procedure: Ultrasound-guided left hip injection: After sterile prep with Betadine, injected 8 cc 1% lidocaine without epinephrine and 40 mg methylprednisolone using a 22-gauge spinal needle, passing the needle through the iliofemoral ligament into the femoral head/neck junction.  Injectate seen filling joint capsule.  Good immediate relief.

## 2020-02-21 ENCOUNTER — Other Ambulatory Visit: Payer: Self-pay | Admitting: Physician Assistant

## 2020-02-21 DIAGNOSIS — I10 Essential (primary) hypertension: Secondary | ICD-10-CM

## 2020-02-25 ENCOUNTER — Telehealth: Payer: Self-pay | Admitting: Radiology

## 2020-02-25 NOTE — Telephone Encounter (Signed)
Yes agree with MR Arthrogram of the hip.  Please go ahead and schedule.  Please give her a call and let her know.  Thanks.

## 2020-02-25 NOTE — Telephone Encounter (Signed)
Will forward to Dr. Roda Shutters.  Might need MRI arthrogram?

## 2020-02-25 NOTE — Telephone Encounter (Signed)
Patient called triage line this morning. She had a hp injection last Wednesday and was feeling 10x better. Thursday she was able to actually stretch her leg and felt really well. On Friday, she started to experience some pain in the hip and this has worsened over the weekend. She did work Friday, Saturday, and Sunday. Last night, she was in such pain that she has been unable to rest or get any relief. She is feeling a tightness all the way down to the calf and states that the back of the calf feels like "it has a blood pressure cuff around it".  Most pain is in the hip. She denies fever or new injury.    Patient requests return call in regards to what she should do.  CB [phone]  Please advise.

## 2020-02-26 ENCOUNTER — Ambulatory Visit (INDEPENDENT_AMBULATORY_CARE_PROVIDER_SITE_OTHER): Payer: Managed Care, Other (non HMO) | Admitting: Physician Assistant

## 2020-02-26 ENCOUNTER — Other Ambulatory Visit: Payer: Self-pay

## 2020-02-26 VITALS — BP 131/84 | HR 94 | Ht 71.0 in | Wt 258.0 lb

## 2020-02-26 DIAGNOSIS — R519 Headache, unspecified: Secondary | ICD-10-CM | POA: Diagnosis not present

## 2020-02-26 DIAGNOSIS — M1612 Unilateral primary osteoarthritis, left hip: Secondary | ICD-10-CM

## 2020-02-26 DIAGNOSIS — I10 Essential (primary) hypertension: Secondary | ICD-10-CM

## 2020-02-26 DIAGNOSIS — G35 Multiple sclerosis: Secondary | ICD-10-CM | POA: Diagnosis not present

## 2020-02-26 MED ORDER — TRIAMTERENE-HCTZ 37.5-25 MG PO CAPS: 1.0000 | ORAL_CAPSULE | Freq: Every day | ORAL | 3 refills | Status: DC

## 2020-02-26 MED ORDER — METOPROLOL SUCCINATE ER 50 MG PO TB24: 50.0000 mg | ORAL_TABLET | Freq: Every day | ORAL | 3 refills | Status: DC

## 2020-02-26 MED ORDER — RIZATRIPTAN BENZOATE 10 MG PO TABS: 10.0000 mg | ORAL_TABLET | ORAL | 0 refills | Status: DC | PRN

## 2020-02-26 NOTE — Progress Notes (Signed)
Subjective:    Patient ID: Jean Yu, female    DOB: 09-10-1980, 39 y.o.   MRN: [id number]  HPI  Pt is a 39 yo female with MS, new OA of left hip, HTN who presents to the clinic for follow up on ED visit for acute headache and elevated blood pressure reading.   Recently she has been having more problems with MS symptoms. She is being managed by neurology but currently not on any medications. She switched neurologist to [facility]. She does not have hx of migraines but on 8/10 had to go to ED for severe headache and BP that she checked at home 200 over 100. She was having vision changes, dizziness, and confusion at that point. No CP, palpitations. At ED full work up was done. Her BP on arrival was great at 129/85. MRI/CT unchanged. EKG WNL.she was given prednisone at discharge. She follow up with neurology where MRV and renal ultrasound were ordered. All normal. BP has been controlled since. No headache since original headache. She was not tested for covid. No other URI symptoms. She is tested once a week at work.   She is seeing ortho of [facility] for left hip arthritis. Had hip injection about 2 weeks ago. Taking mobic daily. Injection helped at first but she is starting to have hip pain again that keeps her up at night.   .. Active Ambulatory Problems    Diagnosis Date Noted  . Hypertension 12/18/2008  . MS (multiple sclerosis) (HCC) 12/09/2005  . BMI 33.0-33.9,adult 03/29/2017  . Obstructive sleep apnea 01-Jun-2017  . Family history of sudden cardiac death in mother 2017/06/01  . Chest pain with low risk of acute coronary syndrome Jun 01, 2017  . Large breasts 08/29/2017  . Class 1 obesity due to excess calories with serious comorbidity and body mass index (BMI) of 34.0 to 34.9 in adult 08/29/2017  . Left arm pain 05/11/2019  . Palpitations 05/11/2019  . Breast skin changes 05/11/2019  . Breast tenderness in female 05/11/2019  . Osteoarthritis of left hip 01/09/2020  . Acute  reaction to stress 02/20/2020  . Carpal tunnel syndrome 09/18/2015  . 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 Degenerative disc disease, lumbar 08/23/2019  . Fatigue 09/18/2015  . High risk medication use 11/16/2017  . 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 Left leg pain 02/17/2018  . Memory loss 09/18/2015  . Numbness and tingling in both hands 09/18/2015  . Oligomenorrhea 04/29/2014  . Premature ventricular contractions (PVCs) (VPCs) 02/20/2020  . Unsteady gait 09/18/2015  . Vitamin D insufficiency 01/01/2013  . Acute intractable headache 02/27/2020   Resolved Ambulatory Problems    Diagnosis Date Noted  . PHLEBITIS&THROMBOPHLEB SUP VESSELS LOWER EXTREM 12/18/2008  . CRAMP IN LIMB 12/18/2008  . EDEMA LEG 12/18/2008   Past Medical History:  Diagnosis Date  . Borderline high cholesterol   . Obesity   . OSA (obstructive sleep apnea)       Review of Systems  All other systems reviewed and are negative.      Objective:   Physical Exam Vitals reviewed.  Constitutional:      Appearance: Normal appearance. She is obese.  Neck:     Vascular: No carotid bruit.  Cardiovascular:     Rate and Rhythm: Normal rate and regular rhythm.     Pulses: Normal pulses.     Heart sounds: Normal heart sounds.  Pulmonary:     Effort: Pulmonary effort is normal.     Breath sounds: Normal breath sounds.  Neurological:     General: No focal deficit present.     Mental Status: She is alert and oriented to person, place, and time.  Psychiatric:        Mood and Affect: Mood normal.           Assessment & Plan:  Marland KitchenMarland KitchenVivi was seen today for hypertension.  Diagnoses and all orders for this visit:  Essential hypertension -     triamterene-hydrochlorothiazide (DYAZIDE) 37.5-25 MG capsule; Take 1 each (1 capsule total) by mouth daily. -     metoprolol succinate (TOPROL-XL)  50 MG 24 hr tablet; Take 1 tablet (50 mg total) by mouth daily.  Osteoarthritis of left hip, unspecified osteoarthritis type  Acute intractable headache, unspecified headache type -     rizatriptan (MAXALT) 10 MG tablet; Take 1 tablet (10 mg total) by mouth as needed for migraine. May repeat in 2 hours if needed  MS (multiple sclerosis) (HCC)   BP looks great today. Continue on same medications. Reviewed labs and recent images. No concerns. Not exactly sure other than MS exacerbation and new headaches that caused BP spike. Pt does think headache came before BP spike. Gave her maxalt as a rescue to keep on hand. We want to avoid the sudden BP changes. Continue to monitor BP at home. She was not tested for covid in hospital due to no other symptoms. She is tested weekly at work and negative. She is vaccinated.   Continue to work with ortho on left hip OA. She is taking mobic daily and explained it can elevated BP as well as cause declined in kidney function with daily use. Will watch this. Most recent GFR normal as well as renal u/s.   Spent 30 minutes with patient reviewed labs, imaging, notes and discussing treatment plan moving forward.

## 2020-02-27 ENCOUNTER — Other Ambulatory Visit: Payer: Self-pay

## 2020-02-27 ENCOUNTER — Encounter: Payer: Self-pay | Admitting: Physician Assistant

## 2020-02-27 DIAGNOSIS — M1612 Unilateral primary osteoarthritis, left hip: Secondary | ICD-10-CM

## 2020-02-27 DIAGNOSIS — R519 Headache, unspecified: Secondary | ICD-10-CM | POA: Insufficient documentation

## 2020-02-27 NOTE — Telephone Encounter (Signed)
Called patient no answer LMOM.

## 2020-02-27 NOTE — Telephone Encounter (Signed)
Order made

## 2020-02-28 NOTE — Addendum Note (Signed)
Addended by: Elvina Mattes T on: 02/28/2020 10:12 AM   Modules accepted: Orders

## 2020-02-28 NOTE — Telephone Encounter (Signed)
Called again no answer  

## 2020-03-20 ENCOUNTER — Encounter: Payer: Self-pay | Admitting: Orthopaedic Surgery

## 2020-03-20 ENCOUNTER — Telehealth: Payer: Self-pay

## 2020-03-20 MED ORDER — PREDNISONE 10 MG (21) PO TBPK: ORAL_TABLET | ORAL | 0 refills | Status: DC

## 2020-03-20 NOTE — Telephone Encounter (Signed)
Did you review her MRI Yet?

## 2020-03-20 NOTE — Telephone Encounter (Signed)
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 Is there anything stronger she can take . She is taking Meloxicam twice a day.   PHARM: [facility]   We have MRI Report. Pending MRI CD. She will try to get that to [place] ASAP.

## 2020-03-20 NOTE — Telephone Encounter (Signed)
See message below. What would you recommend?   

## 2020-03-20 NOTE — Telephone Encounter (Signed)
That she want to come in for cortisone injection or a refill of the previous medication

## 2020-03-20 NOTE — Addendum Note (Signed)
Addended by: Mayra Reel on: 03/20/2020 02:05 PM   Modules accepted: Orders

## 2020-03-20 NOTE — Telephone Encounter (Signed)
Patient called regarding last message on 8/30 she stated pain is worse than before. Patient is off work today if she can be seen today. Call back:[phone]

## 2020-03-20 NOTE — Telephone Encounter (Signed)
Sent in prednisone dose pak.  If this doesn't work, she should come in for injection with [facility].

## 2020-03-21 NOTE — Telephone Encounter (Signed)
I do not see it in the system?

## 2020-03-21 NOTE — Telephone Encounter (Signed)
I left voicemail for patient to call me back.

## 2020-03-24 NOTE — Telephone Encounter (Signed)
Thanks for doing that. Probably should just have her come back to the office so that we can have a full discussion.  Thanks.

## 2020-03-24 NOTE — Telephone Encounter (Signed)
I spoke to patient and discussed mri.  Looks like impingement/oa with labral tear.  Injection was great, but only 2 days relief.  I briefly discussed hip replacement. I told her I would also discuss with you, but that you are out of town until Thursday.  Let me know what you think.

## 2020-03-24 NOTE — Telephone Encounter (Signed)
MRI Report on my desk.

## 2020-03-25 NOTE — Telephone Encounter (Signed)
Jean Yu, can you have her come back in to discuss with xu when he gets back?

## 2020-03-25 NOTE — Telephone Encounter (Signed)
Appt made

## 2020-03-28 ENCOUNTER — Encounter: Payer: Self-pay | Admitting: Orthopaedic Surgery

## 2020-03-28 ENCOUNTER — Ambulatory Visit (INDEPENDENT_AMBULATORY_CARE_PROVIDER_SITE_OTHER): Payer: Managed Care, Other (non HMO) | Admitting: Orthopaedic Surgery

## 2020-03-28 VITALS — Ht 71.0 in | Wt 258.0 lb

## 2020-03-28 DIAGNOSIS — M1612 Unilateral primary osteoarthritis, left hip: Secondary | ICD-10-CM | POA: Insufficient documentation

## 2020-03-28 NOTE — Progress Notes (Signed)
Office Visit Note   Patient: Jean Yu           Date of Birth: [birth year]           MRN: [id number] Visit Date: 03/28/2020              Requested by: [name], PA-C [address] [place],  [place] [address] PCP: [name], PA-C   Assessment & Plan: Visit Diagnoses:  1. Primary osteoarthritis of left hip     Plan: MRI shows mild to moderate early onset DJD with degenerative labral tearing.  These findings were reviewed with the patient.  I have recommended using conservative treatment for as long as possible.  Also discussed the importance of weight loss and home exercises for strengthening.  I do not feel that hip scope would be beneficial for her.  Questions encouraged and answered.  Follow-up as needed.  Follow-Up Instructions: Return if symptoms worsen or fail to improve.   Orders:  No orders of the defined types were placed in this encounter.  No orders of the defined types were placed in this encounter.     Procedures: No procedures performed   Clinical Data: No additional findings.   Subjective: Chief Complaint  Patient presents with  . Left Hip - Pain, Follow-up    MRI review    Patient returns today for MRI review of the left hip.  She had a prior cortisone injection which helped for about 3 days.  Meloxicam also helps.   Review of Systems  Constitutional: Negative.   HENT: Negative.   Eyes: Negative.   Respiratory: Negative.   Cardiovascular: Negative.   Endocrine: Negative.   Musculoskeletal: Negative.   Neurological: Negative.   Hematological: Negative.   Psychiatric/Behavioral: Negative.   All other systems reviewed and are negative.    Objective: Vital Signs: Ht 5\' 11"  (1.803 m)   Wt 258 lb (117 kg)   BMI 35.98 kg/m   Physical Exam Vitals and nursing note reviewed.  Constitutional:      Appearance: She is well-developed.  Pulmonary:     Effort: Pulmonary effort is normal.  Skin:     General: Skin is warm.     Capillary Refill: Capillary refill takes less than 2 seconds.  Neurological:     Mental Status: She is alert and oriented to person, place, and time.  Psychiatric:        Behavior: Behavior normal.        Thought Content: Thought content normal.        Judgment: Judgment normal.     Ortho Exam Left hip exam is unchanged. Specialty Comments:  No specialty comments available.  Imaging: No results found.   PMFS History: Patient Active Problem List   Diagnosis Date Noted  . Primary osteoarthritis of left hip 03/28/2020  . Acute intractable headache 02/27/2020  . Acute reaction to stress 02/20/2020  . 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 Premature ventricular contractions (PVCs) (VPCs) 02/20/2020  . Osteoarthritis of left hip 01/09/2020  . Degenerative disc disease, lumbar 08/23/2019  . 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 Left arm pain 05/11/2019  . Palpitations 05/11/2019  . Breast skin changes 05/11/2019  . Breast tenderness in female 05/11/2019  . Left leg pain 02/17/2018  . High risk medication use 11/16/2017  . Large breasts 08/29/2017  . Class 1 obesity due to excess calories with serious comorbidity and body  mass index (BMI) of 34.0 to 34.9 in adult 08/29/2017  . Obstructive sleep apnea May 27, 2017  . Family history of sudden cardiac death in mother May 27, 2017  . Chest pain with low risk of acute coronary syndrome May 27, 2017  . BMI 33.0-33.9,adult 03/29/2017  . Carpal tunnel syndrome 09/18/2015  . 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 Fatigue 09/18/2015  . Memory loss 09/18/2015  . Numbness and tingling in both hands 09/18/2015  . Unsteady gait 09/18/2015  . Oligomenorrhea 04/29/2014  . Vitamin D insufficiency 01/01/2013  . Hypertension 12/18/2008  . MS (multiple sclerosis) (HCC) 12/09/2005   Past Medical History:  Diagnosis Date  . Borderline high  cholesterol   . Hypertension   . MS (multiple sclerosis) (HCC) 12/09/2005  . Obesity   . OSA (obstructive sleep apnea)     Family History  Problem Relation Age of Onset  . Hypertension Mother   . Heart attack Mother   . Cancer Maternal Aunt     Past Surgical History:  Procedure Laterality Date  . No prior surgery     Social History   Occupational History  . Not on file  Tobacco Use  . Smoking status: Former Games developer  . Smokeless tobacco: Never Used  Vaping Use  . Vaping Use: Never used  Substance and Sexual Activity  . Alcohol use: Yes    Alcohol/week: 0.0 standard drinks    Comment: Rarely  . Drug use: No  . Sexual activity: Not Currently

## 2020-04-10 ENCOUNTER — Emergency Department (INDEPENDENT_AMBULATORY_CARE_PROVIDER_SITE_OTHER)
Admission: EM | Admit: 2020-04-10 | Discharge: 2020-04-10 | Disposition: A | Discharge status: Home / Self Care | Payer: Managed Care, Other (non HMO) | Source: Home / Self Care

## 2020-04-10 ENCOUNTER — Other Ambulatory Visit: Payer: Self-pay

## 2020-04-10 DIAGNOSIS — M79662 Pain in left lower leg: Secondary | ICD-10-CM

## 2020-04-10 DIAGNOSIS — M79652 Pain in left thigh: Secondary | ICD-10-CM

## 2020-04-10 DIAGNOSIS — M7989 Other specified soft tissue disorders: Secondary | ICD-10-CM | POA: Diagnosis not present

## 2020-04-10 DIAGNOSIS — M25552 Pain in left hip: Secondary | ICD-10-CM

## 2020-04-10 NOTE — Discharge Instructions (Addendum)
  Please proceed to the emergency department for further evaluation and treatment of your pain and leg swelling this evening.  The hospital has the resources needed to rule out a blood clot in the evening.

## 2020-04-10 NOTE — ED Triage Notes (Signed)
Patient presents to Urgent Care with complaints of left leg pain since last night. Patient reports her hip makes her leg hurt a lot but it has gotten to be unbearable. Pt states the pain starts in her whole thigh and comes in waves but never goes away. Took 2 meloxicam this morning, has been using a heating pad and keeping it elevated.

## 2020-04-10 NOTE — ED Provider Notes (Signed)
[facility]    CSN: [id number] Arrival date & time: 04/10/20  1855      History   Chief Complaint Chief Complaint  Patient presents with  . Leg Pain    left    HPI Jean Yu is a 39 y.o. female.   HPI  Jean Yu is a 39 y.o. female presenting to UC with extensive past medical history including hx of MS and OA of Left hip presenting this evening with c/o severe Left hip and leg pain that started yesterday, pain has worsened to 10/10 severity, associated mild swelling and calf pain.  She has taken 2 Meloxicam this morning without relief. Pain is more severe and persistent than her usual pain, and she reports redness and warmth in her calf, which has concerned pt. No hx of clots. No recent travel or surgery. Denies chest pain or SOB.    BP elevated, hx of HTN. Pt believes BP is high due to the pain. Denies HA or dizziness.    Past Medical History:  Diagnosis Date  . Borderline high cholesterol   . Hypertension   . MS (multiple sclerosis) (HCC) 12/09/2005  . Obesity   . OSA (obstructive sleep apnea)     Patient Active Problem List   Diagnosis Date Noted  . Primary osteoarthritis of left hip 03/28/2020  . Acute intractable headache 02/27/2020  . Acute reaction to stress 02/20/2020  . 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 Premature ventricular contractions (PVCs) (VPCs) 02/20/2020  . Osteoarthritis of left hip 01/09/2020  . Degenerative disc disease, lumbar 08/23/2019  . 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 Left arm pain 05/11/2019  . Palpitations 05/11/2019  . Breast skin changes 05/11/2019  . Breast tenderness in female 05/11/2019  . Left leg pain 02/17/2018  . High risk medication use 11/16/2017  . Large breasts 08/29/2017  . Class 1 obesity due to excess calories with serious comorbidity and body mass index (BMI) of 34.0 to 34.9 in adult 08/29/2017  . Obstructive sleep apnea  2017/05/17  . Family history of sudden cardiac death in mother 05/17/17  . Chest pain with low risk of acute coronary syndrome 2017-05-17  . BMI 33.0-33.9,adult 03/29/2017  . Carpal tunnel syndrome 09/18/2015  . 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 Fatigue 09/18/2015  . Memory loss 09/18/2015  . Numbness and tingling in both hands 09/18/2015  . Unsteady gait 09/18/2015  . Oligomenorrhea 04/29/2014  . Vitamin D insufficiency 01/01/2013  . Hypertension 12/18/2008  . MS (multiple sclerosis) (HCC) 12/09/2005    Past Surgical History:  Procedure Laterality Date  . No prior surgery      OB History   No obstetric history on file.      Home Medications    Prior to Admission medications   Medication Sig Start Date End Date Taking? Authorizing Provider  Ascorbic Acid (VITAMIN C) 1000 MG tablet Take 1,000 mg by mouth daily.    [provider]  cholecalciferol (VITAMIN D) 1000 units tablet Take 1,000 Units by mouth daily.    [provider]  MAGNESIUM PO Take by mouth daily.    [provider]  meloxicam (MOBIC) 7.5 MG tablet Take 1-2 tablets (7.5-15 mg total) by mouth daily. 12/27/19   Tollie Eth, NP  metoprolol succinate (TOPROL-XL) 50 MG 24 hr tablet Take 1 tablet (50 mg total) by mouth daily. 02/26/20   [name], PA-C  Multiple Vitamins-Minerals (ZINC PO) Take by mouth daily.    [provider]  predniSONE (STERAPRED UNI-PAK 21 TAB) 10 MG (21) TBPK tablet Take as directed 03/20/20   Tarry Kos, MD  rizatriptan (MAXALT) 10 MG tablet Take 1 tablet (10 mg total) by mouth as needed for migraine. May repeat in 2 hours if needed 02/26/20   [name], PA-C  triamterene-hydrochlorothiazide (DYAZIDE) 37.5-25 MG capsule Take 1 each (1 capsule total) by mouth daily. 02/26/20   Breeback, [name] L, PA-C  vitamin B-12 (CYANOCOBALAMIN) 1000 MCG tablet Take by mouth.    [provider]    Family History Family  History  Problem Relation Age of Onset  . Hypertension Mother   . Heart attack Mother   . Cancer Maternal Aunt     Social History Social History   Tobacco Use  . Smoking status: Former Games developer  . Smokeless tobacco: Never Used  Vaping Use  . Vaping Use: Never used  Substance Use Topics  . Alcohol use: Yes    Alcohol/week: 0.0 standard drinks    Comment: Rarely  . Drug use: No     Allergies   Patient has no known allergies.   Review of Systems Review of Systems  Musculoskeletal: Positive for arthralgias and myalgias.  Skin: Positive for color change. Negative for wound.     Physical Exam Triage Vital Signs ED Triage Vitals  Enc Vitals Group     BP 04/10/20 1906 (!) 155/101     Pulse Rate 04/10/20 1906 81     Resp 04/10/20 1906 16     Temp 04/10/20 1906 98.8 F (37.1 C)     Temp Source 04/10/20 1906 Oral     SpO2 04/10/20 1906 99 %     Weight --      Height --      Head Circumference --      Peak Flow --      Pain Score 04/10/20 1904 10     Pain Loc --      Pain Edu? --      Excl. in GC? --    No data found.  Updated Vital Signs BP (!) 155/101 (BP Location: Right Arm)   Pulse 81   Temp 98.8 F (37.1 C) (Oral)   Resp 16   LMP 03/22/2020 (Exact Date)   SpO2 99%   Visual Acuity Right Eye Distance:   Left Eye Distance:   Bilateral Distance:    Right Eye Near:   Left Eye Near:    Bilateral Near:     Physical Exam Vitals and nursing note reviewed.  Constitutional:      Appearance: Normal appearance. She is well-developed.  HENT:     Head: Normocephalic and atraumatic.  Cardiovascular:     Rate and Rhythm: Normal rate.  Pulmonary:     Effort: Pulmonary effort is normal. No respiratory distress.  Musculoskeletal:        General: Swelling and tenderness present. Normal range of motion.     Cervical back: Normal range of motion.     Comments: Back: mild tenderness Left mid to lower back. Left hip and LE: tenderness over joint without crepitus.  Tenderness anterior thigh, calf tenderness, mild edema to lower leg compared to Right leg. Muscle compartment feels more firm than Right calf.  Skin:    General: Skin is warm and dry.     Findings: No bruising or erythema.  Neurological:     Mental Status: She  is alert and oriented to person, place, and time.  Psychiatric:        Behavior: Behavior normal.      UC Treatments / Results  Labs (all labs ordered are listed, but only abnormal results are displayed) Labs Reviewed - No data to display  EKG   Radiology No results found.  Procedures Procedures (including critical care time)  Medications Ordered in UC Medications - No data to display  Initial Impression / Assessment and Plan / UC Course  I have reviewed the triage vital signs and the nursing notes.  Pertinent labs & imaging results that were available during my care of the patient were reviewed by me and considered in my medical decision making (see chart for details).     Concern for DVT US unavailable at this facility this evening Due to severity of pt's pain, recommend pt go to emergency department this evening for further evaluation and treatment Pt understanding and agreeable with tx plan. Pt plans to drive POV to [facility], has been there in the past.   Final Clinical Impressions(s) / UC Diagnoses   Final diagnoses:  Pain and swelling of left lower leg  Left thigh pain  Left hip pain     Discharge Instructions      Please proceed to the emergency department for further evaluation and treatment of your pain and leg swelling this evening.  The hospital has the resources needed to rule out a blood clot in the evening.     ED Prescriptions    None     PDMP not reviewed this encounter.   Lurene Shadow, [place] 04/10/20 1938

## 2020-04-15 ENCOUNTER — Ambulatory Visit (INDEPENDENT_AMBULATORY_CARE_PROVIDER_SITE_OTHER): Payer: Managed Care, Other (non HMO) | Admitting: Orthopaedic Surgery

## 2020-04-15 ENCOUNTER — Encounter: Payer: Self-pay | Admitting: Orthopaedic Surgery

## 2020-04-15 DIAGNOSIS — M1612 Unilateral primary osteoarthritis, left hip: Secondary | ICD-10-CM

## 2020-04-15 MED ORDER — PREDNISONE 10 MG (21) PO TBPK: ORAL_TABLET | ORAL | 0 refills | Status: DC

## 2020-04-15 NOTE — Progress Notes (Signed)
Office Visit Note   Patient: Jean Yu           Date of Birth: 19-Jan-1981           MRN: [id number] Visit Date: 04/15/2020              Requested by: [name], PA-C [address] [place],  [place] [address] PCP: [name], PA-C   Assessment & Plan: Visit Diagnoses:  1. Primary osteoarthritis of left hip     Plan: Impression is left hip labral tear and mild to moderate degenerative changes.  I do not think we need to repeat cortisone injection as she only had three days relief with last injection.  She is hesitant to take pain medication such as tramadol.  I will call in a sterapred taper in the  Meantime.    Follow-Up Instructions: Return if symptoms worsen or fail to improve.   Orders:  No orders of the defined types were placed in this encounter.  No orders of the defined types were placed in this encounter.     Procedures: No procedures performed   Clinical Data: No additional findings.   Subjective: Chief Complaint  Patient presents with  . Lower Back - Pain    HPI patient is a pleasant 39 year old female who comes in today with continued left hip pain.  History of left hip labral tear and mild to moderate degenerative changes seen on recent MRI.  She was initially referred to Dr. Prince Rome for intra-articular cortisone injection which did help, but only lasted for about 3 days.  She is try to manage her pain with Mobic, heat and rest but this is not seeming to alleviate her symptoms.  She was recently seen in the ED with left lower extremity pain and swelling where DVT was ruled out.  She was provided a prescription for Robaxin which causes her to sleep.  She is having trouble working due to the pain.  Review of Systems as detailed in HPI.  All others reviewed and are negative.   Objective: Vital Signs: LMP 03/22/2020 (Exact Date)   Physical Exam well-developed well-nourished female no acute distress.  Alert and  oriented x3.  Ortho Exam left hip exam shows a markedly positive logroll with decreased internal rotation.  She is neurovascular intact distally.  Specialty Comments:  No specialty comments available.  Imaging: No new imaging   PMFS History: Patient Active Problem List   Diagnosis Date Noted  . Primary osteoarthritis of left hip 03/28/2020  . Acute intractable headache 02/27/2020  . Acute reaction to stress 02/20/2020  . 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 Premature ventricular contractions (PVCs) (VPCs) 02/20/2020  . Osteoarthritis of left hip 01/09/2020  . Degenerative disc disease, lumbar 08/23/2019  . 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 Left arm pain 05/11/2019  . Palpitations 05/11/2019  . Breast skin changes 05/11/2019  . Breast tenderness in female 05/11/2019  . Left leg pain 02/17/2018  . High risk medication use 11/16/2017  . Large breasts 08/29/2017  . Class 1 obesity due to excess calories with serious comorbidity and body mass index (BMI) of 34.0 to 34.9 in adult 08/29/2017  . Obstructive sleep apnea May 23, 2017  . Family history of sudden cardiac death in mother 2017/05/23  . Chest pain with low risk of acute coronary syndrome 2017-05-23  . BMI 33.0-33.9,adult 03/29/2017  . Carpal tunnel syndrome 09/18/2015  . Circadian  rhythm sleep disorder, 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  . Fatigue 09/18/2015  . Memory loss 09/18/2015  . Numbness and tingling in both hands 09/18/2015  . Unsteady gait 09/18/2015  . Oligomenorrhea 04/29/2014  . Vitamin D insufficiency 01/01/2013  . Hypertension 12/18/2008  . MS (multiple sclerosis) (HCC) 12/09/2005   Past Medical History:  Diagnosis Date  . Borderline high cholesterol   . Hypertension   . MS (multiple sclerosis) (HCC) 12/09/2005  . Obesity   . OSA (obstructive sleep apnea)     Family History  Problem Relation Age of Onset  . Hypertension Mother   . Heart attack  Mother   . Cancer Maternal Aunt     Past Surgical History:  Procedure Laterality Date  . No prior surgery     Social History   Occupational History  . Not on file  Tobacco Use  . Smoking status: Former Games developer  . Smokeless tobacco: Never Used  Vaping Use  . Vaping Use: Never used  Substance and Sexual Activity  . Alcohol use: Yes    Alcohol/week: 0.0 standard drinks    Comment: Rarely  . Drug use: No  . Sexual activity: Not Currently

## 2020-04-16 ENCOUNTER — Ambulatory Visit: Payer: Managed Care, Other (non HMO) | Admitting: Physician Assistant

## 2020-04-17 ENCOUNTER — Telehealth: Payer: Self-pay | Admitting: Orthopaedic Surgery

## 2020-04-17 NOTE — Telephone Encounter (Signed)
That is fine 

## 2020-04-17 NOTE — Telephone Encounter (Signed)
Pt called stating she needed her work note revised; she needs it to say no excessive walking or any excessive motion on the left side. Pt would like to have this emailed when it's complete  [email or web address]

## 2020-04-18 NOTE — Telephone Encounter (Signed)
Emailed to patient.

## 2020-06-13 ENCOUNTER — Other Ambulatory Visit: Payer: Self-pay

## 2020-06-13 ENCOUNTER — Emergency Department (INDEPENDENT_AMBULATORY_CARE_PROVIDER_SITE_OTHER)
Admission: EM | Admit: 2020-06-13 | Discharge: 2020-06-13 | Disposition: A | Discharge status: Home / Self Care | Payer: Managed Care, Other (non HMO) | Source: Home / Self Care

## 2020-06-13 ENCOUNTER — Telehealth: Payer: Self-pay | Admitting: Emergency Medicine

## 2020-06-13 ENCOUNTER — Ambulatory Visit (INDEPENDENT_AMBULATORY_CARE_PROVIDER_SITE_OTHER): Payer: Managed Care, Other (non HMO)

## 2020-06-13 DIAGNOSIS — M79605 Pain in left leg: Secondary | ICD-10-CM

## 2020-06-13 DIAGNOSIS — G35 Multiple sclerosis: Secondary | ICD-10-CM

## 2020-06-13 DIAGNOSIS — R252 Cramp and spasm: Secondary | ICD-10-CM | POA: Diagnosis not present

## 2020-06-13 DIAGNOSIS — R2243 Localized swelling, mass and lump, lower limb, bilateral: Secondary | ICD-10-CM | POA: Diagnosis not present

## 2020-06-13 DIAGNOSIS — M25552 Pain in left hip: Secondary | ICD-10-CM

## 2020-06-13 DIAGNOSIS — G35D Multiple sclerosis, unspecified: Secondary | ICD-10-CM

## 2020-06-13 DIAGNOSIS — M5136 Other intervertebral disc degeneration, lumbar region: Secondary | ICD-10-CM

## 2020-06-13 DIAGNOSIS — M79604 Pain in right leg: Secondary | ICD-10-CM | POA: Diagnosis not present

## 2020-06-13 DIAGNOSIS — G8929 Other chronic pain: Secondary | ICD-10-CM

## 2020-06-13 LAB — CBC WITH DIFFERENTIAL/PLATELET
Absolute Monocytes: 548 cells/uL (ref 200–950)
Basophils Absolute: 19 cells/uL (ref 0–200)
Basophils Relative: 0.3 %
Eosinophils Absolute: 19 cells/uL (ref 15–500)
Eosinophils Relative: 0.3 %
HCT: 37 % (ref 35.0–45.0)
Hemoglobin: 12.4 g/dL (ref 11.7–15.5)
Lymphs Abs: 1493 cells/uL (ref 850–3900)
MCH: 28.9 pg (ref 27.0–33.0)
MCHC: 33.5 g/dL (ref 32.0–36.0)
MCV: 86.2 fL (ref 80.0–100.0)
MPV: 11.2 fL (ref 7.5–12.5)
Monocytes Relative: 8.7 %
Neutro Abs: 4221 cells/uL (ref 1500–7800)
Neutrophils Relative %: 67 %
Platelets: 217 10*3/uL (ref 140–400)
RBC: 4.29 10*6/uL (ref 3.80–5.10)
RDW: 12.1 % (ref 11.0–15.0)
Total Lymphocyte: 23.7 %
WBC: 6.3 10*3/uL (ref 3.8–10.8)

## 2020-06-13 LAB — BASIC METABOLIC PANEL
BUN: 15 mg/dL (ref 7–25)
CO2: 29 mmol/L (ref 20–32)
Calcium: 9.2 mg/dL (ref 8.6–10.2)
Chloride: 104 mmol/L (ref 98–110)
Creat: 0.84 mg/dL (ref 0.50–1.10)
Glucose, Bld: 85 mg/dL (ref 65–99)
Potassium: 3.8 mmol/L (ref 3.5–5.3)
Sodium: 139 mmol/L (ref 135–146)

## 2020-06-13 LAB — MAGNESIUM: Magnesium: 2.1 mg/dL (ref 1.5–2.5)

## 2020-06-13 LAB — D-DIMER, QUANTITATIVE: D-Dimer, Quant: 0.85 mcg/mL FEU — ABNORMAL HIGH (ref <0.50)

## 2020-06-13 MED ORDER — MAGNESIUM OXIDE (ELEMENTAL) 400 MG PO TABS: 1.0000 | ORAL_TABLET | Freq: Every evening | ORAL | 0 refills | Status: DC

## 2020-06-13 NOTE — Telephone Encounter (Signed)
Reassured pt of no DVT in [place]. Pt denies chest pain or SOB. Doubt PE.  Reassured no change in DDD in Lumbar spine per plain films. Pt had MRI lumbar spine in February 2021 for same symptoms (left leg pain and intermittent swelling).  Discussed pt with Dr. Benjamin Stain, sports medicine. Will have pt try trial of magnesium oxide nightly. Pt instructed to schedule f/u with Dr. Benjamin Stain next week for recheck of symptoms. Discussed symptoms that warrant emergent care in the ED.

## 2020-06-13 NOTE — ED Triage Notes (Signed)
Patient presents to Urgent Care with complaints of left leg pain in her ankle that progressed up her leg since last night in her sleep. Patient reports the pain was tight and squeezing like a BP cuff was squeezing on her leg. Pain in lower leg subsided and then returned in her upper leg (not as strong as when in lower leg). Pain then moved to right upper leg and was the same kind of pain as the left leg. Pt spoke w/ an after hours nurse and was directed to the ED where she [name]. Pt has hx of problems w/ her left hip and HTN. A&O x4 upon arrival, no neuro deficits at this time.

## 2020-06-13 NOTE — ED Provider Notes (Signed)
Jean Yu [facility]    CSN: [id number] Arrival date & time: 06/13/20  0910      History   Chief Complaint Chief Complaint  Patient presents with  . Leg Pain    Left    HPI Jean Yu is a 39 y.o. female.   HPI Jean Yu is a 39 y.o. female presenting to UC with hx of primary OA of Left hip, trochanteric bursitis, DDD in lumbar spine and MS c/o Left leg pain and weakness that started last night. Pain woke her from her sleep; reports feeling a "squeezing like a BP cuff" in her lower leg, that radiated up into her Left thigh and into her Right thigh.  Right thigh pain has resolved but left leg pain and weakness sensation have persisted. She called her PCP who instructed her to go to th ED. After a 2 hour wait in the ED, pt [name] and came to UC. Pt concerned about a clot in her leg.  She has had 2 negative lower extremity ultrasounds for Left leg pain this year, once in January 2021, and most recent October 2021. She notes intermittent leg swelling. No prior hx of clots. No recent surgery or travel. No chest pain or SOB. Per pt and medical records, pt had a Left hip injection with lidocaine and triamcinolone on 06/04/20.  She was advised pain would improve in about 5 days but states pain has only worsened and feels "different."  She has not called her orthopedist about current pain. She was prescribed celebrex but had to wait on insurance approval, plans to pick up the medication today. Pt was is also followed by [facility] neurology for MS.  She has not notified them of today's symptoms either.  Reports being on prednisone in the past without much pain relief. States it makes her angry.    Past Medical History:  Diagnosis Date  . Borderline high cholesterol   . Hypertension   . MS (multiple sclerosis) (HCC) 12/09/2005  . Obesity   . OSA (obstructive sleep apnea)     Patient Active Problem List   Diagnosis Date Noted  . Primary osteoarthritis of left hip  03/28/2020  . Acute intractable headache 02/27/2020  . Acute reaction to stress 02/20/2020  . 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 Premature ventricular contractions (PVCs) (VPCs) 02/20/2020  . Osteoarthritis of left hip 01/09/2020  . Degenerative disc disease, lumbar 08/23/2019  . 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 Left arm pain 05/11/2019  . Palpitations 05/11/2019  . Breast skin changes 05/11/2019  . Breast tenderness in female 05/11/2019  . Left leg pain 02/17/2018  . High risk medication use 11/16/2017  . Large breasts 08/29/2017  . Class 1 obesity due to excess calories with serious comorbidity and body mass index (BMI) of 34.0 to 34.9 in adult 08/29/2017  . Obstructive sleep apnea 05/16/2017  . Family history of sudden cardiac death in mother 05/16/17  . Chest pain with low risk of acute coronary syndrome 05/16/2017  . BMI 33.0-33.9,adult 03/29/2017  . Carpal tunnel syndrome 09/18/2015  . 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 Fatigue 09/18/2015  . Memory loss 09/18/2015  . Numbness and tingling in both hands 09/18/2015  . Unsteady gait 09/18/2015  . Oligomenorrhea 04/29/2014  . Vitamin D insufficiency 01/01/2013  . Hypertension 12/18/2008  . MS (multiple sclerosis) (HCC) 12/09/2005    Past Surgical History:  Procedure Laterality Date  .  No prior surgery      OB History   No obstetric history on file.      Home Medications    Prior to Admission medications   Medication Sig Start Date End Date Taking? Authorizing Provider  Ascorbic Acid (VITAMIN C) 1000 MG tablet Take 1,000 mg by mouth daily.    [provider]  cholecalciferol (VITAMIN D) 1000 units tablet Take 1,000 Units by mouth daily.    [provider]  MAGNESIUM PO Take by mouth daily.    [provider]  meloxicam (MOBIC) 7.5 MG tablet Take 1-2 tablets (7.5-15 mg total) by mouth daily. 12/27/19    Tollie Eth, NP  metoprolol succinate (TOPROL-XL) 50 MG 24 hr tablet Take 1 tablet (50 mg total) by mouth daily. 02/26/20   Breeback, [name] L, PA-C  Multiple Vitamins-Minerals (ZINC PO) Take by mouth daily.    [provider]  predniSONE (STERAPRED UNI-PAK 21 TAB) 10 MG (21) TBPK tablet Take as directed 04/15/20   Cristie Hem, PA-C  rizatriptan (MAXALT) 10 MG tablet Take 1 tablet (10 mg total) by mouth as needed for migraine. May repeat in 2 hours if needed 02/26/20   [name], PA-C  triamterene-hydrochlorothiazide (DYAZIDE) 37.5-25 MG capsule Take 1 each (1 capsule total) by mouth daily. 02/26/20   Breeback, [name] L, PA-C  vitamin B-12 (CYANOCOBALAMIN) 1000 MCG tablet Take by mouth.    [provider]    Family History Family History  Problem Relation Age of Onset  . Hypertension Mother   . Heart attack Mother   . Cancer Maternal Aunt     Social History Social History   Tobacco Use  . Smoking status: Former Games developer  . Smokeless tobacco: Never Used  Vaping Use  . Vaping Use: Never used  Substance Use Topics  . Alcohol use: Yes    Alcohol/week: 0.0 standard drinks    Comment: Rarely  . Drug use: No     Allergies   Patient has no known allergies.   Review of Systems Review of Systems  Constitutional: Negative for chills and fever.  Genitourinary: Negative for dysuria, flank pain and frequency.  Musculoskeletal: Positive for arthralgias, back pain (mild), gait problem, joint swelling and myalgias. Negative for neck pain and neck stiffness.  Neurological: Positive for weakness. Negative for numbness.     Physical Exam Triage Vital Signs ED Triage Vitals  Enc Vitals Group     BP 06/13/20 0921 (!) 126/93     Pulse Rate 06/13/20 0921 86     Resp 06/13/20 0921 16     Temp 06/13/20 0921 98.1 F (36.7 C)     Temp Source 06/13/20 0921 Oral     SpO2 06/13/20 0921 97 %     Weight --      Height --      Head Circumference --      Peak Flow --       Pain Score 06/13/20 0920 8     Pain Loc --      Pain Edu? --      Excl. in GC? --    No data found.  Updated Vital Signs BP (!) 126/93 (BP Location: Right Arm)   Pulse 86   Temp 98.1 F (36.7 C) (Oral)   Resp 16   SpO2 97%   Visual Acuity Right Eye Distance:   Left Eye Distance:   Bilateral Distance:    Right Eye Near:   Left Eye Near:  Bilateral Near:     Physical Exam Vitals and nursing note reviewed.  Constitutional:      Appearance: Normal appearance. She is well-developed and well-nourished. She is obese.  HENT:     Head: Normocephalic and atraumatic.  Eyes:     Extraocular Movements: EOM normal.  Cardiovascular:     Rate and Rhythm: Normal rate and regular rhythm.     Pulses:          Dorsalis pedis pulses are 2+ on the right side and 2+ on the left side.  Pulmonary:     Effort: Pulmonary effort is normal.  Musculoskeletal:        General: Tenderness present. Normal range of motion.     Cervical back: Normal range of motion.     Right lower leg: No edema.     Left lower leg: No edema.     Comments: Mild lower lumbar muscle tenderness bilaterally, worse on Left side. No localized bony tenderness. Left hip: tenderness over trochanteric bursa. No erythema or warmth.  Left leg: diffuse muscular tenderness, increased pain with movement of hip, knee and ankle. Muscle compartments including calf are soft. No masses palpated. Right leg: full ROM, non-tender. Antalgic gait. Able to ambulate without assistance.   Skin:    General: Skin is warm and dry.     Findings: No bruising or erythema.  Neurological:     Mental Status: She is alert and oriented to person, place, and time.  Psychiatric:        Mood and Affect: Mood and affect normal.        Behavior: Behavior normal.      UC Treatments / Results  Labs (all labs ordered are listed, but only abnormal results are displayed) Labs Reviewed  BASIC METABOLIC PANEL  MAGNESIUM  D-DIMER, QUANTITATIVE  (NOT AT [facility])  CBC WITH DIFFERENTIAL/PLATELET    EKG   Radiology No results found.  Procedures Procedures (including critical care time)  Medications Ordered in UC Medications - No data to display  Initial Impression / Assessment and Plan / UC Course  I have reviewed the triage vital signs and the nursing notes.  Pertinent labs & imaging results that were available during my care of the patient were reviewed by me and considered in my medical decision making (see chart for details).    Discussed pt with her orthopedist, Barbara Cower Heggerick, PA at [facility], suspect leg pain more c/w hx of MS rather than recent hip injection. No red flag symptoms at this time. Called Dr. Trena Platt, [facility] neurology office, pt scheduled for in-office visit January 4th. Pt declined steroid taper at this time. Will tx celebrex Labs pending: CBC, BMP, magnesium, D-dimer. Discussed symptoms that warrant emergent care in the ED. Pt verbalized understanding and agreement with tx plan. AVS given  Final Clinical Impressions(s) / UC Diagnoses   Final diagnoses:  Leg cramps  Left leg pain  Chronic left hip pain  MS (multiple sclerosis) (HCC)     Discharge Instructions      You have been scheduled for a follow up appointment with your neurologist, Dr. Trena Platt, for January 4th at [facility]. You will need to call the office to reschedule if this time/day does not work for you, but next appointment was not until April 2022.  It is recommended you try to keep this appointment if possible for worsening leg pain and weakness.   Call 911 or have someone drive you to the hospital if symptoms significantly worsening-  difficulty walking, worsening leg weakness, difficulty going to the bathroom, or other new concerning symptoms develop.     ED Prescriptions    None     PDMP not reviewed this encounter.   Lurene Shadow, PA-C 06/13/20 1141

## 2020-06-13 NOTE — Telephone Encounter (Signed)
Ddimer slightly elevated, will go ahead and order bilateral LE [place] for DVT study. Pt on her way back to [facility] for imaging.

## 2020-06-13 NOTE — Discharge Instructions (Signed)
  You have been scheduled for a follow up appointment with your neurologist, Dr. Trena Platt, for January 4th at [facility]. You will need to call the office to reschedule if this time/day does not work for you, but next appointment was not until April 2022.  It is recommended you try to keep this appointment if possible for worsening leg pain and weakness.   Call 911 or have someone drive you to the hospital if symptoms significantly worsening- difficulty walking, worsening leg weakness, difficulty going to the bathroom, or other new concerning symptoms develop.

## 2020-06-19 ENCOUNTER — Telehealth: Payer: Self-pay | Admitting: Nurse Practitioner

## 2020-06-19 ENCOUNTER — Telehealth (INDEPENDENT_AMBULATORY_CARE_PROVIDER_SITE_OTHER): Payer: Managed Care, Other (non HMO) | Admitting: Nurse Practitioner

## 2020-06-19 ENCOUNTER — Encounter: Payer: Self-pay | Admitting: Nurse Practitioner

## 2020-06-19 VITALS — BP 126/92 | HR 98

## 2020-06-19 DIAGNOSIS — R52 Pain, unspecified: Secondary | ICD-10-CM | POA: Diagnosis not present

## 2020-06-19 DIAGNOSIS — R197 Diarrhea, unspecified: Secondary | ICD-10-CM | POA: Diagnosis not present

## 2020-06-19 DIAGNOSIS — R112 Nausea with vomiting, unspecified: Secondary | ICD-10-CM | POA: Diagnosis not present

## 2020-06-19 DIAGNOSIS — R6889 Other general symptoms and signs: Secondary | ICD-10-CM | POA: Diagnosis not present

## 2020-06-19 LAB — POCT INFLUENZA A/B
Influenza A, POC: NEGATIVE
Influenza B, POC: NEGATIVE

## 2020-06-19 MED ORDER — ONDANSETRON 8 MG PO TBDP: 8.0000 mg | ORAL_TABLET | Freq: Three times a day (TID) | ORAL | 3 refills | Status: DC | PRN

## 2020-06-19 MED ORDER — PROMETHAZINE HCL 25 MG PO TABS: ORAL_TABLET | ORAL | 0 refills | Status: DC

## 2020-06-19 NOTE — Patient Instructions (Signed)
It sounds like you may have the flu.   I have called in medication for you for the nausea and vomiting. Use the ondansetron first, as this is not sedating and can be tolerated better during the day. If this is not effective, then you can take the promethazine.   You can use imodium or pepto bismol for diarrhea.   I want you to rest as much as possible and drink plenty of fluids. Start foods slowly when you feel like eating and stick with a bland and soft diet (bananas, rice, applesauce, toast, broth, jello) until you can hold down solids.   If you are unable to keep liquids and solids down, please let me know immediately.  I will let you know the results of your flu swab and send in medication for you and [name] if it is positive.   I am so sorry you are sick at Christmas! I really hope you feel better soon!

## 2020-06-19 NOTE — Telephone Encounter (Signed)
Flu results negative

## 2020-06-19 NOTE — Progress Notes (Signed)
Please call the patient and let her know that her flu test was negative today, which is encouraging. She most likely has a viral infection that will hopefully resolve over the next few days and she may return to work when she feels better. Let us know if symptoms worsen or do not improve.

## 2020-06-19 NOTE — Progress Notes (Signed)
Virtual Video Visit via MyChart Note  I connected with  Jean Yu on 06/19/20 at  8:50 AM EST by the video enabled telemedicine application for , MyChart, and verified that I am speaking with the correct person using two identifiers.   I introduced myself as a Publishing rights manager with the practice. We discussed the limitations of evaluation and management by telemedicine and the availability of in person appointments. The patient expressed understanding and agreed to proceed.  Participating parties in this visit include: The patient and the nurse practitioner listed only The patient is: at home I am: In the office  Subjective:    CC:  Chief Complaint  Patient presents with  . Nausea  . Diarrhea  . Generalized Body Aches    HPI: Jean Yu is a 39 y.o. y/o female presenting via MyChart today for nausea, vomiting, diarrhea, and generalized body aches that started suddenly yesterday morning.  She does report that she began a new prescription for Celebrex on Monday and on Tuesday she said her face felt tight and then developed the other symptoms. She is unsure if some of her symptoms are related to that so she has stopped taking the medication at this time.   She did have a COVID test yesterday and it was negative. 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 She has not been vaccinated against the flu. She has been vaccinated of COVID and has had her booster.   She endorses terrible body aches, weakness, chills, hot flashes, nausea, vomiting, diarrhea, and headache.   She denies known fevers, congestion, sinus pain or pressure, cough, sore throat, loss of taste, loss of smell, shortness of breath, or difficulty breathing.   She has been using a heating pad on her lower back to help with the pain.   Past medical history, Surgical history, Family history not pertinant except as noted below, Social history, Allergies, and medications  have been entered into the medical record, reviewed, and corrections made.   Review of Systems:  All review of systems negative except what is listed in the HPI   Objective:    General: Speaking clearly in complete sentences without any shortness of breath.   Alert and oriented x3.   Normal judgment.  No apparent acute distress. Patient is in bed and ill appearing  Impression and Recommendations:    1. Flu-like symptoms 2. Intractable vomiting with nausea, unspecified vomiting type 3. Diarrhea, unspecified type 4. Generalized body aches Symptoms and presentation consistent with influenza. She has not been vaccinated this season. COVID test was negative- she is vaccinated and has had a booster.  Will plan for flu swab in the office today. If positive, will treat with tamiflu for her and her daughter, as she is within the window of symptom onset for treatment.  Antiemetics provided for nausea and vomiting, recommend anti-diarrheal medication for diarrhea.  Recommend increased hydration, BRAT diet when vomiting has subsided, and increased fluids for hydration.  Recommend out of work as long as symptoms are present. If flu is positive, out of work until 06/24/2020. Work note provided for patient.  - ondansetron (ZOFRAN-ODT) 8 MG disintegrating tablet; Take 1 tablet (8 mg total) by mouth every 8 (eight) hours as needed for nausea.  Dispense: 20 tablet; Refill: 3 - promethazine (PHENERGAN) 25 MG tablet; Take 1/2 (12.5mg ) to 1 (25mg ) tablets up to every 8 hours for nausea and vomiting not controlled with ondansetron. Will make you sleepy.  Dispense: 20 tablet; Refill: 0      I discussed the assessment and treatment plan with the patient. The patient was provided an opportunity to ask questions and all were answered. The patient agreed with the plan and demonstrated an understanding of the instructions.   The patient was advised to call back or seek an in-person evaluation if the symptoms  worsen or if the condition fails to improve as anticipated.  I provided 22 minutes of non-face-to-face interaction with this MYCHART visit including intake, same-day documentation, and chart review.   Tollie Eth, NP

## 2020-07-10 ENCOUNTER — Other Ambulatory Visit: Payer: Self-pay

## 2020-07-10 ENCOUNTER — Encounter: Payer: Self-pay | Admitting: Family Medicine

## 2020-07-10 ENCOUNTER — Ambulatory Visit (INDEPENDENT_AMBULATORY_CARE_PROVIDER_SITE_OTHER): Payer: Managed Care, Other (non HMO) | Admitting: Family Medicine

## 2020-07-10 VITALS — BP 132/85 | HR 106 | Ht 71.0 in | Wt 246.0 lb

## 2020-07-10 DIAGNOSIS — I952 Hypotension due to drugs: Secondary | ICD-10-CM | POA: Insufficient documentation

## 2020-07-10 DIAGNOSIS — I10 Essential (primary) hypertension: Secondary | ICD-10-CM | POA: Diagnosis not present

## 2020-07-10 DIAGNOSIS — R55 Syncope and collapse: Secondary | ICD-10-CM | POA: Diagnosis not present

## 2020-07-10 NOTE — Assessment & Plan Note (Signed)
She had hypotension and syncope while in clinic this afternoon likely related to hydralazine.  Good response to fluid bolus however did not feel that she was safe to drive home and she wished to return to the ED for continued observation.  Vital signs improved once EMS had arrived.

## 2020-07-10 NOTE — Assessment & Plan Note (Signed)
She usually has hypertension.  Currently on maxzide and metoprolol.  She seems to have paroxysmal episodes of hypertensive crisis in addition to her chronic hypertension.  Additional work up may be needed for secondary causes of HTN  if this continues

## 2020-07-10 NOTE — Progress Notes (Signed)
Jean Yu - 40 y.o. female MRN [id number]  Date of birth: [birth year]  Subjective No chief complaint on file.   HPI Jean Yu is a 40 y.o. female with history of HTN and MS here today for follow up of HTN.  She called the clinic this morning and had BP reading at home of >200/110.  She also had dizziness, blurred vision and chest discomfort.  She was directed to go to the ED for evaluation.  She stated that she would but also wanted an appointment for follow up later this afternoon to discuss long term plan for her HTN.   Upon arrival to her appointment she stated she didn't feel well and was feeling like she was going to pass out.  She kept stating she wasn't sure what she was given in the ER but she felt "weird".  Stated she felt like her vision was fading and then had syncopal episode while seated.  No seizure activity noted and she did not hit her head.  This lasted about 30 seconds before she regained consciousness.  We elevated her legs and checked her pulse and BP.  HR low in the 40's and BP non detectable with automated cuff.  Manual check of 80/46.   EMS contacted to transport to hospital.  Shawna Clamp, DNP started IV and she was given 1 liter bolus of normal saline.  BP improved to 120/83 after bolus of fluid and heart rate in the 80's.  She was conversing and acting normally at this time. She did complain of tingling and tightness throughout her extremities after her BP and HR improved.  She states that she was given blood pressure medication through her IV at the hospital about 15 minutes prior to discharge.  Notes from ED unavailable but it appears she received 10mg  of IV hydralazine.  Unclear if she was re-checked after giving medication.  Vital signs improved after fluid bolus  however she still didn't feel well and wished to be transported back to hospital for continued observation, which I agree with at this time.  EMS arrived to transport at this point.     ROS:  A comprehensive ROS was completed and negative except as noted per HPI  No Known Allergies  Past Medical History:  Diagnosis Date  . Borderline high cholesterol   . Hypertension   . MS (multiple sclerosis) (HCC) 12/09/2005  . Obesity   . OSA (obstructive sleep apnea)     Past Surgical History:  Procedure Laterality Date  . No prior surgery      Social History   Socioeconomic History  . Marital status: Single    Spouse name: Not on file  . Number of children: Not on file  . Years of education: Not on file  . Highest education level: Not on file  Occupational History  . Not on file  Tobacco Use  . Smoking status: Former 12/11/2005  . Smokeless tobacco: Never Used  Vaping Use  . Vaping Use: Never used  Substance and Sexual Activity  . Alcohol use: Yes    Alcohol/week: 0.0 standard drinks    Comment: Rarely  . Drug use: No  . Sexual activity: Not Currently  Other Topics Concern  . Not on file  Social History Narrative  . Not on file   Social Determinants of Health   Financial Resource Strain: Not on file  Food Insecurity: Not on file  Transportation Needs: Not on file  Physical Activity: Not on file  Stress: Not on file  Social Connections: Not on file    Family History  Problem Relation Age of Onset  . Hypertension Mother   . Heart attack Mother   . Cancer Maternal Aunt     Health Maintenance  Topic Date Due  . COVID-19 Vaccine (3 - Booster for Moderna series) 02/28/2020  . INFLUENZA VACCINE  09/25/2020 (Originally 01/27/2020)  . Hepatitis C Screening  12/26/2020 (Originally [birth year]1980)  . HIV Screening  06/19/2021 (Originally 12/23/1995)  . PAP SMEAR-Modifier  04/24/2021  . TETANUS/TDAP  10/31/2024     ----------------------------------------------------------------------------------------------------------------------------------------------------------------------------------------------------------------- Physical Exam BP 132/85   Pulse  (!) 106   Ht 5\' 11"  (1.803 m)   Wt 246 lb (111.6 kg)   BMI 34.31 kg/m   Physical Exam Constitutional:      Appearance: Normal appearance.  HENT:     Head: Normocephalic and atraumatic.  Eyes:     General: No scleral icterus. Cardiovascular:     Rate and Rhythm: Normal rate and regular rhythm.  Pulmonary:     Effort: Pulmonary effort is normal.     Breath sounds: Normal breath sounds.  Musculoskeletal:     Cervical back: Neck supple.  Neurological:     General: No focal deficit present.     Mental Status: She is alert and oriented to person, place, and time.     Cranial Nerves: No cranial nerve deficit.  Psychiatric:        Mood and Affect: Mood normal.     ------------------------------------------------------------------------------------------------------------------------------------------------------------------------------------------------------------------- Assessment and Plan  Hypotension due to drugs She had hypotension and syncope while in clinic this afternoon likely related to hydralazine.  Good response to fluid bolus however did not feel that she was safe to drive home and she wished to return to the ED for continued observation.  Vital signs improved once EMS had arrived.    Hypertension She usually has hypertension.  Currently on maxzide and metoprolol.  She seems to have paroxysmal episodes of hypertensive crisis in addition to her chronic hypertension.  Additional work up may be needed for secondary causes of HTN  if this continues   No orders of the defined types were placed in this encounter.   No follow-ups on file.  50 minutes spent including pre visit preparation, review of prior notes and labs, encounter with patient and same day documentation.   This visit occurred during the SARS-CoV-2 public health emergency.  Safety protocols were in place, including screening questions prior to the visit, additional usage of staff PPE, and extensive cleaning  of exam room while observing appropriate contact time as indicated for disinfecting solutions.

## 2020-07-11 ENCOUNTER — Telehealth: Payer: Self-pay

## 2020-07-11 NOTE — Telephone Encounter (Signed)
Ok for Monday 

## 2020-07-11 NOTE — Progress Notes (Addendum)
Evaluation and Procedure Note: 07/10/2020  Patient presented for OV for HTN. During the visit I was alerted by attending physician that patient was experiencing syncopal episode, most likely related to hypotension. Upon entering room, patient was upright in chair, eyes closed and decreased LOC, but able to speak.  HR in the 50's. Brachial pulses palpable, but thready.  Elevated lower extremities due to inability to safely move patient to laying position.   Right AC prepped with chlorhexidine swab and tourniquet placed on right proximal upper arm. #20g IV placed on one attempt with adequate flash at right [facility]. IV Flushed with NS and site monitored for infiltration, without evidence or complication.  1L NS bolus started with pressure bag attached.    Patients LOC improved during evaluation. She was tearful, but able to converse normally and answer questions appropriately. Pulse improved to 70's-80's and brachial pulses were +2 bilaterally.   Patient reported tingling in upper and lower extremities, but no weakness or altered mental status.  EMS transported to ED for further evaluation based on recommendations from attending provider.

## 2020-07-11 NOTE — Telephone Encounter (Signed)
Patient states the hospital took her out of work for 7 days but didn't provide a note.   Wants to schedule a video visit for hospital f/u.

## 2020-07-11 NOTE — Addendum Note (Signed)
Addended by: Magdeline Prange, [place] Dec E on: 07/11/2020 08:12 AM   Modules accepted: Orders

## 2020-07-15 ENCOUNTER — Telehealth (INDEPENDENT_AMBULATORY_CARE_PROVIDER_SITE_OTHER): Payer: Managed Care, Other (non HMO) | Admitting: Physician Assistant

## 2020-07-15 ENCOUNTER — Encounter: Payer: Self-pay | Admitting: Physician Assistant

## 2020-07-15 VITALS — BP 142/95 | HR 78

## 2020-07-15 DIAGNOSIS — I952 Hypotension due to drugs: Secondary | ICD-10-CM

## 2020-07-15 DIAGNOSIS — I10 Essential (primary) hypertension: Secondary | ICD-10-CM

## 2020-07-15 DIAGNOSIS — K30 Functional dyspepsia: Secondary | ICD-10-CM | POA: Insufficient documentation

## 2020-07-15 DIAGNOSIS — E876 Hypokalemia: Secondary | ICD-10-CM

## 2020-07-15 DIAGNOSIS — R079 Chest pain, unspecified: Secondary | ICD-10-CM | POA: Diagnosis not present

## 2020-07-15 DIAGNOSIS — R142 Eructation: Secondary | ICD-10-CM

## 2020-07-15 DIAGNOSIS — D649 Anemia, unspecified: Secondary | ICD-10-CM

## 2020-07-15 MED ORDER — OMEPRAZOLE 40 MG PO CPDR: 40.0000 mg | DELAYED_RELEASE_CAPSULE | Freq: Every day | ORAL | 2 refills | Status: DC

## 2020-07-15 NOTE — Progress Notes (Signed)
..Virtual Visit via Video Note  I connected with Jean Yu on 07/15/20 at  8:30 AM EST by a video enabled telemedicine application and verified that I am speaking with the correct person using two identifiers.  Location: Patient: home Provider: clinic  .Marland KitchenParticipating in visit:  Patient: Jean Yu Provider: Tandy Gaw PA-C   I discussed the limitations of evaluation and management by telemedicine and the availability of in person appointments. The patient expressed understanding and agreed to proceed.  History of Present Illness: Patient is a 40 year old obese female with hypertension and MS who calls into the clinic to follow-up after 2 ED visits.  On 1/13 patient went to the emergency room after her blood pressure spiked to 200/113 with right sided chest pains.  EKG was unremarkable for change. She did have low potassium at 3.1 and anemia at 11.2.  She was then started on hydralazine 10mg  and told to follow up with PCP. Later that day she came to clinic and BP was 132/86 on arrival but while she was in the clinic her BP dropped into the 80's over 60's and she had a syncopal event. She was given IV fluids and hydralazine stopped. She continues on metoprolol and dyazide. Her BP at home are 150s over 90s.   Patient continues to have center to right sided chest pains that are intermittent.  She denies that they are with exertion or with breathing.  They can last about 15 to 20 minutes.  She had one this morning.  It was relieved by burping and flatulence.  She denies any cough.  Her stools have been a little more fluffy and loose but denies any melena or hematochezia.  She denies any vomiting. She has taken tums a few times.  Tums do seem to help minimally.  Patient is very concerned about why her blood pressure is elevating all of a sudden.     Active Ambulatory Problems    Diagnosis Date Noted  . Hypertension 12/18/2008  . MS (multiple sclerosis) (HCC) 12/09/2005  .  BMI 33.0-33.9,adult 03/29/2017  . Obstructive sleep apnea 05-13-17  . Family history of sudden cardiac death in mother May 13, 2017  . Chest pain with low risk of acute coronary syndrome 05-13-2017  . Large breasts 08/29/2017  . Class 1 obesity due to excess calories with serious comorbidity and body mass index (BMI) of 34.0 to 34.9 in adult 08/29/2017  . Left arm pain 05/11/2019  . Palpitations 05/11/2019  . Breast skin changes 05/11/2019  . Breast tenderness in female 05/11/2019  . Osteoarthritis of left hip 01/09/2020  . Acute reaction to stress 02/20/2020  . Carpal tunnel syndrome 09/18/2015  . 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 Degenerative disc disease, lumbar 08/23/2019  . Fatigue 09/18/2015  . High risk medication use 11/16/2017  . 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 Left leg pain 02/17/2018  . Memory loss 09/18/2015  . Numbness and tingling in both hands 09/18/2015  . Oligomenorrhea 04/29/2014  . Premature ventricular contractions (PVCs) (VPCs) 02/20/2020  . Unsteady gait 09/18/2015  . Vitamin D insufficiency 01/01/2013  . Acute intractable headache 02/27/2020  . Primary osteoarthritis of left hip 03/28/2020  . Hypotension due to drugs 07/10/2020   Resolved Ambulatory Problems    Diagnosis Date Noted  . PHLEBITIS&THROMBOPHLEB SUP VESSELS LOWER EXTREM 12/18/2008  . CRAMP IN LIMB 12/18/2008  . EDEMA LEG 12/18/2008   Past  Medical History:  Diagnosis Date  . Borderline high cholesterol   . Obesity   . OSA (obstructive sleep apnea)    Reviewed med, allergy, problem list.     Observations/Objective: No acute distress Normal breathing No cough Normal mood and appearance  .    Assessment and Plan: Marland KitchenMarland KitchenDiagnoses and all orders for this visit:  Right-sided chest pain -     omeprazole (PRILOSEC) 40 MG capsule; Take 1 capsule (40 mg total) by  mouth daily. -     H. pylori breath test  Anemia, unspecified type -     CBC with Differential/Platelet -     Fe+TIBC+Fer  Hypokalemia -     COMPLETE METABOLIC PANEL WITH GFR -     omeprazole (PRILOSEC) 40 MG capsule; Take 1 capsule (40 mg total) by mouth daily.  Burping -     omeprazole (PRILOSEC) 40 MG capsule; Take 1 capsule (40 mg total) by mouth daily. -     H. pylori breath test  Indigestion -     H. pylori breath test  Hypotension due to drugs  Primary hypertension   Syncope caused by hypotension due to hydralazine.   Exact etiology of her right-sided chest pain is unclear.  This does not sound like cardiac chest pain.  She was previously worked up in 06/2019 for chest pain by Dr. Jens Som and was negative with low risk for acute coronary syndrome.  Upon further questioning it does sound like she is having a lot of indigestion, burping, gassiness.  I would like to test for H. pylori with a breath test.  She will then start omeprazole daily.  We will follow-up in 2 weeks to see if her symptoms have improved.  Her blood pressure is a little elevated today.  She admits she has not taken her blood pressure medicine today.  She does have a blood pressure cuff at home.  She will begin to check her blood pressure more frequently with the goal of staying under 140/90.  In the hospital she did have some hypokalemia and some anemia.  We will recheck this and blood work today.  Overall I think she is cleared to go back to work on Saturday if her symptoms continue to improve.  She will let me know on Friday how she is doing for a work note.  We will follow-up in clinic in 2 weeks.   Follow Up Instructions:    I discussed the assessment and treatment plan with the patient. The patient was provided an opportunity to ask questions and all were answered. The patient agreed with the plan and demonstrated an understanding of the instructions.   The patient was advised to call back or seek an  in-person evaluation if the symptoms worsen or if the condition fails to improve as anticipated.   Tandy Gaw, PA-C

## 2020-07-17 ENCOUNTER — Encounter: Payer: Self-pay | Admitting: Physician Assistant

## 2020-07-17 LAB — CBC WITH DIFFERENTIAL/PLATELET
Absolute Monocytes: 481 cells/uL (ref 200–950)
Basophils Absolute: 22 cells/uL (ref 0–200)
Basophils Relative: 0.4 %
Eosinophils Absolute: 49 cells/uL (ref 15–500)
Eosinophils Relative: 0.9 %
HCT: 38.7 % (ref 35.0–45.0)
Hemoglobin: 13 g/dL (ref 11.7–15.5)
Lymphs Abs: 1436 cells/uL (ref 850–3900)
MCH: 28.3 pg (ref 27.0–33.0)
MCHC: 33.6 g/dL (ref 32.0–36.0)
MCV: 84.1 fL (ref 80.0–100.0)
MPV: 11.9 fL (ref 7.5–12.5)
Monocytes Relative: 8.9 %
Neutro Abs: 3413 cells/uL (ref 1500–7800)
Neutrophils Relative %: 63.2 %
Platelets: 235 10*3/uL (ref 140–400)
RBC: 4.6 10*6/uL (ref 3.80–5.10)
RDW: 12.7 % (ref 11.0–15.0)
Total Lymphocyte: 26.6 %
WBC: 5.4 10*3/uL (ref 3.8–10.8)

## 2020-07-17 LAB — COMPLETE METABOLIC PANEL WITH GFR
AG Ratio: 1.3 (calc) (ref 1.0–2.5)
ALT: 16 U/L (ref 6–29)
AST: 15 U/L (ref 10–30)
Albumin: 4.1 g/dL (ref 3.6–5.1)
Alkaline phosphatase (APISO): 43 U/L (ref 31–125)
BUN: 16 mg/dL (ref 7–25)
CO2: 28 mmol/L (ref 20–32)
Calcium: 9.5 mg/dL (ref 8.6–10.2)
Chloride: 102 mmol/L (ref 98–110)
Creat: 0.75 mg/dL (ref 0.50–1.10)
GFR, Est African American: 116 mL/min/{1.73_m2} (ref >=60)
GFR, Est Non African American: 100 mL/min/{1.73_m2} (ref >=60)
Globulin: 3.2 g/dL (calc) (ref 1.9–3.7)
Glucose, Bld: 81 mg/dL (ref 65–99)
Potassium: 4.4 mmol/L (ref 3.5–5.3)
Sodium: 138 mmol/L (ref 135–146)
Total Bilirubin: 0.4 mg/dL (ref 0.2–1.2)
Total Protein: 7.3 g/dL (ref 6.1–8.1)

## 2020-07-17 LAB — IRON,TIBC AND FERRITIN PANEL
%SAT: 30 % (calc) (ref 16–45)
Ferritin: 106 ng/mL (ref 16–154)
Iron: 111 ug/dL (ref 40–190)
TIBC: 376 mcg/dL (calc) (ref 250–450)

## 2020-07-17 LAB — H. PYLORI BREATH TEST: H. pylori Breath Test: NOT DETECTED

## 2020-07-17 NOTE — Telephone Encounter (Signed)
Let me know if okay and I will wrote RTW note.

## 2020-07-18 NOTE — Progress Notes (Deleted)
      HPI: FU palpitations.  Lower extremity venous Dopplers July 02, 2019 showed no DVT on the left.  Echo 1/21 showed normal LV function. Monitor 2/21 showed no significant arrhythmia. Since last seen,  Current Outpatient Medications  Medication Sig Dispense Refill  . Ascorbic Acid (VITAMIN C) 1000 MG tablet Take 1,000 mg by mouth daily.    . celecoxib (CELEBREX) 200 MG capsule Take by mouth 2 (two) times daily.    . cholecalciferol (VITAMIN D) 1000 units tablet Take 1,000 Units by mouth daily.    . metoprolol succinate (TOPROL-XL) 50 MG 24 hr tablet Take 1 tablet (50 mg total) by mouth daily. 90 tablet 3  . Multiple Vitamins-Minerals (ZINC PO) Take by mouth daily.    [name] omeprazole (PRILOSEC) 40 MG capsule Take 1 capsule (40 mg total) by mouth daily. 30 capsule 2  . triamterene-hydrochlorothiazide (DYAZIDE) 37.5-25 MG capsule Take 1 each (1 capsule total) by mouth daily. 90 capsule 3  . vitamin B-12 (CYANOCOBALAMIN) 1000 MCG tablet Take by mouth.     No current facility-administered medications for this visit.     Past Medical History:  Diagnosis Date  . Borderline high cholesterol   . Hypertension   . MS (multiple sclerosis) (HCC) 12/09/2005  . Obesity   . OSA (obstructive sleep apnea)     Past Surgical History:  Procedure Laterality Date  . No prior surgery      Social History   Socioeconomic History  . Marital status: Single    Spouse name: Not on file  . Number of children: Not on file  . Years of education: Not on file  . Highest education level: Not on file  Occupational History  . Not on file  Tobacco Use  . Smoking status: Former Games developer  . Smokeless tobacco: Never Used  Vaping Use  . Vaping Use: Never used  Substance and Sexual Activity  . Alcohol use: Yes    Alcohol/week: 0.0 standard drinks    Comment: Rarely  . Drug use: No  . Sexual activity: Not Currently  Other Topics Concern  . Not on file  Social History Narrative  . Not on file   Social  Determinants of Health   Financial Resource Strain: Not on file  Food Insecurity: Not on file  Transportation Needs: Not on file  Physical Activity: Not on file  Stress: Not on file  Social Connections: Not on file  Intimate Partner Violence: Not on file    Family History  Problem Relation Age of Onset  . Hypertension Mother   . Heart attack Mother   . Cancer Maternal Aunt     ROS: no fevers or chills, productive cough, hemoptysis, dysphasia, odynophagia, melena, hematochezia, dysuria, hematuria, rash, seizure activity, orthopnea, PND, pedal edema, claudication. Remaining systems are negative.  Physical Exam: Well-developed well-nourished in no acute distress.  Skin is warm and dry.  HEENT is normal.  Neck is supple.  Chest is clear to auscultation with normal expansion.  Cardiovascular exam is regular rate and rhythm.  Abdominal exam nontender or distended. No masses palpated. Extremities show no edema. neuro grossly intact  ECG- personally reviewed  A/P  1 Palpitations-  2 hypertension-BP controlled; continue present meds and follow.   Olga Millers, MD

## 2020-07-18 NOTE — Progress Notes (Signed)
Sheka,   Negative for h.pylori.  Labs look good.  Potassium and sodium look good.  Normal hemoglobin and iron.

## 2020-07-22 ENCOUNTER — Ambulatory Visit: Payer: Managed Care, Other (non HMO) | Admitting: Cardiology

## 2020-08-25 ENCOUNTER — Ambulatory Visit (INDEPENDENT_AMBULATORY_CARE_PROVIDER_SITE_OTHER): Payer: Managed Care, Other (non HMO) | Admitting: Physician Assistant

## 2020-08-25 DIAGNOSIS — Z5329 Procedure and treatment not carried out because of patient's decision for other reasons: Secondary | ICD-10-CM

## 2020-08-25 NOTE — Progress Notes (Signed)
No show

## 2020-09-16 ENCOUNTER — Ambulatory Visit (INDEPENDENT_AMBULATORY_CARE_PROVIDER_SITE_OTHER): Payer: Managed Care, Other (non HMO) | Admitting: Physician Assistant

## 2020-09-16 ENCOUNTER — Other Ambulatory Visit: Payer: Self-pay

## 2020-09-16 ENCOUNTER — Encounter: Payer: Self-pay | Admitting: Physician Assistant

## 2020-09-16 VITALS — BP 137/88 | HR 76 | Ht 71.0 in | Wt 249.0 lb

## 2020-09-16 DIAGNOSIS — M7062 Trochanteric bursitis, left hip: Secondary | ICD-10-CM | POA: Diagnosis not present

## 2020-09-16 DIAGNOSIS — M1612 Unilateral primary osteoarthritis, left hip: Secondary | ICD-10-CM

## 2020-09-16 DIAGNOSIS — I10 Essential (primary) hypertension: Secondary | ICD-10-CM

## 2020-09-16 DIAGNOSIS — M25552 Pain in left hip: Secondary | ICD-10-CM | POA: Diagnosis not present

## 2020-09-16 MED ORDER — DICLOFENAC SODIUM 1 % EX GEL: 4.0000 g | Freq: Four times a day (QID) | CUTANEOUS | 1 refills | Status: DC

## 2020-09-16 NOTE — Progress Notes (Signed)
Subjective:    Patient ID: Jean Yu, female    DOB: 03/22/1981, 40 y.o.   MRN: [id number]  HPI  Patient is a 40 year old obese female with hypertension, left hip pain who presents to clinic for follow-up.  Patient is checking her blood pressures at home and ranging in the 130s over 80s.  She denies any chest pain, chest tightness, headache, vision changes, dizziness.  She is taking her Dyazide and metoprolol daily.  She continues to have left hip pain.  She was previously diagnosed by orthopedics with osteoarthritis and bursitis.  She was given Celebrex.  It gave her a lot of GI upset and some black looking stools.  She did not like this.  She has stopped since.   .. Active Ambulatory Problems    Diagnosis Date Noted  . Essential hypertension 12/18/2008  . MS (multiple sclerosis) (HCC) 12/09/2005  . BMI 33.0-33.9,adult 03/29/2017  . Obstructive sleep apnea May 06, 2017  . Family history of sudden cardiac death in mother 05-06-2017  . Chest pain with low risk of acute coronary syndrome 06-May-2017  . Large breasts 08/29/2017  . Class 1 obesity due to excess calories with serious comorbidity and body mass index (BMI) of 34.0 to 34.9 in adult 08/29/2017  . Left arm pain 05/11/2019  . Palpitations 05/11/2019  . Breast skin changes 05/11/2019  . Breast tenderness in female 05/11/2019  . Osteoarthritis of left hip 01/09/2020  . Acute reaction to stress 02/20/2020  . Carpal tunnel syndrome 09/18/2015  . 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 Degenerative disc disease, lumbar 08/23/2019  . Fatigue 09/18/2015  . High risk medication use 11/16/2017  . 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 Left leg pain 02/17/2018  . Memory loss 09/18/2015  . Numbness and tingling in both hands 09/18/2015  . Oligomenorrhea 04/29/2014  . Premature ventricular contractions (PVCs)  (VPCs) 02/20/2020  . Unsteady gait 09/18/2015  . Vitamin D insufficiency 01/01/2013  . Acute intractable headache 02/27/2020  . Primary osteoarthritis of left hip 03/28/2020  . Hypotension due to drugs 07/10/2020  . Anemia 07/15/2020  . Hypokalemia 07/15/2020  . Right-sided chest pain 07/15/2020  . Burping 07/15/2020  . Indigestion 07/15/2020  . Left hip pain 09/16/2020   Resolved Ambulatory Problems    Diagnosis Date Noted  . PHLEBITIS&THROMBOPHLEB SUP VESSELS LOWER EXTREM 12/18/2008  . CRAMP IN LIMB 12/18/2008  . EDEMA LEG 12/18/2008   Past Medical History:  Diagnosis Date  . Borderline high cholesterol   . Hypertension   . Obesity   . OSA (obstructive sleep apnea)      Review of Systems  All other systems reviewed and are negative.      Objective:   Physical Exam Vitals reviewed.  Constitutional:      Appearance: Normal appearance. She is obese.  Neck:     Vascular: No carotid bruit.  Cardiovascular:     Rate and Rhythm: Normal rate and regular rhythm.     Pulses: Normal pulses.     Heart sounds: Normal heart sounds.  Pulmonary:     Effort: Pulmonary effort is normal.     Breath sounds: Normal breath sounds.  Musculoskeletal:     Right lower leg: No edema.     Left lower leg: No edema.  Neurological:     General: No focal deficit present.     Mental Status: She is alert.  Psychiatric:  Mood and Affect: Mood normal.           Assessment & Plan:  Marland KitchenMarland KitchenLovely was seen today for hypertension.  Diagnoses and all orders for this visit:  Essential hypertension  Left hip pain -     diclofenac Sodium (VOLTAREN) 1 % GEL; Apply 4 g topically 4 (four) times daily. To affected joint.  Primary osteoarthritis of left hip -     diclofenac Sodium (VOLTAREN) 1 % GEL; Apply 4 g topically 4 (four) times daily. To affected joint.  Trochanteric bursitis of left hip -     diclofenac Sodium (VOLTAREN) 1 % GEL; Apply 4 g topically 4 (four) times daily. To  affected joint.   BP to goal. Continue on same medications. Dyazide and metoprolol. Continue to work on low salt diet and regular exercise.   Left hip pain due to OA and bursitis. celebrex causes GI upset. Try voltaren gel and tumeric 500mg  bid.follow up with ortho.  Discussed at times injections help and PT. Low impact exercise such as swimming and biking encouraged.   Follow up 6 months BP and with sports medicine if problem worsening.

## 2020-09-16 NOTE — Patient Instructions (Addendum)
Tumeric 500mg  twice a day.  voltaren gel.

## 2020-09-22 ENCOUNTER — Encounter: Payer: Self-pay | Admitting: Physician Assistant

## 2020-10-16 NOTE — Progress Notes (Deleted)
      HPI: FU palpitations. Echocardiogram January 2021 showed normal LV function.  Monitor February 2021 showed no significant arrhythmia.  Venous Dopplers December 2021 showed no DVT.  Seen for possible syncopal episode January 2022 at [facility].  Since last seen  Current Outpatient Medications  Medication Sig Dispense Refill  . Ascorbic Acid (VITAMIN C) 1000 MG tablet Take 1,000 mg by mouth daily.    . cholecalciferol (VITAMIN D) 1000 units tablet Take 1,000 Units by mouth daily.    . diclofenac Sodium (VOLTAREN) 1 % GEL Apply 4 g topically 4 (four) times daily. To affected joint. 100 g 1  . metoprolol succinate (TOPROL-XL) 50 MG 24 hr tablet Take 1 tablet (50 mg total) by mouth daily. 90 tablet 3  . Multiple Vitamins-Minerals (ZINC PO) Take by mouth daily.    [name] triamterene-hydrochlorothiazide (DYAZIDE) 37.5-25 MG capsule Take 1 each (1 capsule total) by mouth daily. 90 capsule 3  . Turmeric 500 MG CAPS Take 500 mg by mouth in the morning and at bedtime.    . vitamin B-12 (CYANOCOBALAMIN) 1000 MCG tablet Take by mouth.     No current facility-administered medications for this visit.     Past Medical History:  Diagnosis Date  . Borderline high cholesterol   . Hypertension   . MS (multiple sclerosis) (HCC) 12/09/2005  . Obesity   . OSA (obstructive sleep apnea)     Past Surgical History:  Procedure Laterality Date  . No prior surgery      Social History   Socioeconomic History  . Marital status: Single    Spouse name: Not on file  . Number of children: Not on file  . Years of education: Not on file  . Highest education level: Not on file  Occupational History  . Not on file  Tobacco Use  . Smoking status: Former Games developer  . Smokeless tobacco: Never Used  Vaping Use  . Vaping Use: Never used  Substance and Sexual Activity  . Alcohol use: Yes    Alcohol/week: 0.0 standard drinks    Comment: Rarely  . Drug use: No  . Sexual activity: Not Currently  Other Topics  Concern  . Not on file  Social History Narrative  . Not on file   Social Determinants of Health   Financial Resource Strain: Not on file  Food Insecurity: Not on file  Transportation Needs: Not on file  Physical Activity: Not on file  Stress: Not on file  Social Connections: Not on file  Intimate Partner Violence: Not on file    Family History  Problem Relation Age of Onset  . Hypertension Mother   . Heart attack Mother   . Cancer Maternal Aunt     ROS: no fevers or chills, productive cough, hemoptysis, dysphasia, odynophagia, melena, hematochezia, dysuria, hematuria, rash, seizure activity, orthopnea, PND, pedal edema, claudication. Remaining systems are negative.  Physical Exam: Well-developed well-nourished in no acute distress.  Skin is warm and dry.  HEENT is normal.  Neck is supple.  Chest is clear to auscultation with normal expansion.  Cardiovascular exam is regular rate and rhythm.  Abdominal exam nontender or distended. No masses palpated. Extremities show no edema. neuro grossly intact  ECG- personally reviewed  A/P  1 palpitations-previous monitor unremarkable.  Echocardiogram shows normal LV function.  Continue beta-blocker.  2 history of syncope-  3 hypertension-blood pressure controlled.  Continue present medications.  Olga Millers, MD

## 2020-10-22 ENCOUNTER — Ambulatory Visit: Payer: Managed Care, Other (non HMO) | Admitting: Cardiology

## 2020-11-14 ENCOUNTER — Other Ambulatory Visit: Payer: Self-pay

## 2020-11-14 ENCOUNTER — Ambulatory Visit (INDEPENDENT_AMBULATORY_CARE_PROVIDER_SITE_OTHER): Payer: Managed Care, Other (non HMO) | Admitting: Physician Assistant

## 2020-11-14 ENCOUNTER — Encounter: Payer: Self-pay | Admitting: Physician Assistant

## 2020-11-14 VITALS — BP 127/89 | HR 84 | Temp 98.0°F | Ht 71.0 in | Wt 252.0 lb

## 2020-11-14 DIAGNOSIS — R5383 Other fatigue: Secondary | ICD-10-CM | POA: Diagnosis not present

## 2020-11-14 DIAGNOSIS — K625 Hemorrhage of anus and rectum: Secondary | ICD-10-CM

## 2020-11-14 DIAGNOSIS — K649 Unspecified hemorrhoids: Secondary | ICD-10-CM

## 2020-11-14 DIAGNOSIS — T17308A Unspecified foreign body in larynx causing other injury, initial encounter: Secondary | ICD-10-CM

## 2020-11-14 DIAGNOSIS — J3489 Other specified disorders of nose and nasal sinuses: Secondary | ICD-10-CM

## 2020-11-14 MED ORDER — METHYLPREDNISOLONE 4 MG PO TBPK: ORAL_TABLET | ORAL | 0 refills | Status: DC

## 2020-11-14 MED ORDER — PANTOPRAZOLE SODIUM 40 MG PO TBEC: 40.0000 mg | DELAYED_RELEASE_TABLET | Freq: Every day | ORAL | 2 refills | Status: DC

## 2020-11-14 MED ORDER — FLUTICASONE PROPIONATE 50 MCG/ACT NA SUSP: 2.0000 | Freq: Every day | NASAL | 0 refills | Status: DC

## 2020-11-14 NOTE — Progress Notes (Signed)
Subjective:    Patient ID: Jean Yu, female    DOB: October 15, 1980, 40 y.o.   MRN: [id number]  HPI  Pt is a 40 yo obese female with OSA, MS, HTN, hemorrhoids who presents to the clinic with some concerns.   She has had more phelgm for the last year. She just feels like she is always coughing/clearing her throat. Recently gotten worse and now feels like she is choking at times. Does not seem to be related to food. No epigastric pain. Has hemorrhoids and intermittent bright red blood in stool. Last colonoscopy 8 years ago. No abdominal pain. Stools are more mucus like now. She does feel like she has some reflux but tums and pepto do not help.   She is really fatigued since January. She has CPAP due to OSA.   She has not seen neurology for MS recently.   She has more sinus drainage, congestion, facial pain for the last few weeks. Reports ear popping and congestion.    .. Active Ambulatory Problems    Diagnosis Date Noted  . Essential hypertension 12/18/2008  . MS (multiple sclerosis) ([place]) 12/09/2005  . BMI 33.0-33.9,adult 03/29/2017  . Obstructive sleep apnea Jun 02, 2017  . Family history of sudden cardiac death in mother 2017/06/02  . Chest pain with low risk of acute coronary syndrome 06-02-2017  . Large breasts 08/29/2017  . Class 1 obesity due to excess calories with serious comorbidity and body mass index (BMI) of 34.0 to 34.9 in adult 08/29/2017  . Left arm pain 05/11/2019  . Palpitations 05/11/2019  . Breast skin changes 05/11/2019  . Breast tenderness in female 05/11/2019  . Osteoarthritis of left hip 01/09/2020  . Acute reaction to stress 02/20/2020  . Carpal tunnel syndrome 09/18/2015  . 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 Degenerative disc disease, lumbar 08/23/2019  . Fatigue 09/18/2015  . High risk medication use 11/16/2017  . HSV-2 (herpes simplex virus 2)  infection 02/20/2020  . Left leg pain 02/17/2018  . Memory loss 09/18/2015  . Numbness and tingling in both hands 09/18/2015  . Oligomenorrhea 04/29/2014  . Premature ventricular contractions (PVCs) (VPCs) 02/20/2020  . Unsteady gait 09/18/2015  . Vitamin D insufficiency 01/01/2013  . Acute intractable headache 02/27/2020  . Primary osteoarthritis of left hip 03/28/2020  . Hypotension due to drugs 07/10/2020  . Anemia 07/15/2020  . Hypokalemia 07/15/2020  . Right-sided chest pain 07/15/2020  . Burping 07/15/2020  . Indigestion 07/15/2020  . Left hip pain 09/16/2020  . Bright red blood per rectum 11/20/2020  . Hemorrhoids 11/20/2020  . Choking 11/20/2020  . Sinus drainage 11/20/2020   Resolved Ambulatory Problems    Diagnosis Date Noted  . PHLEBITIS&THROMBOPHLEB SUP VESSELS LOWER EXTREM 12/18/2008  . CRAMP IN LIMB 12/18/2008  . EDEMA LEG 12/18/2008   Past Medical History:  Diagnosis Date  . Borderline high cholesterol   . Hypertension   . Obesity   . OSA (obstructive sleep apnea)        Review of Systems See HPI.     Objective:   Physical Exam Vitals reviewed.  Constitutional:      Appearance: Normal appearance. She is obese.  HENT:     Head: Normocephalic.     Right Ear: Tympanic membrane normal.     Left Ear: Tympanic membrane normal.     Nose: Nose normal.     Mouth/Throat:  Pharynx: Posterior oropharyngeal erythema present.  Eyes:     Conjunctiva/sclera: Conjunctivae normal.     Pupils: Pupils are equal, round, and reactive to light.  Cardiovascular:     Rate and Rhythm: Normal rate and regular rhythm.     Pulses: Normal pulses.  Pulmonary:     Effort: Pulmonary effort is normal.     Breath sounds: Normal breath sounds.  Abdominal:     General: There is no distension.     Palpations: Abdomen is soft. There is no mass.     Tenderness: There is no abdominal tenderness. There is no right CVA tenderness, left CVA tenderness or guarding.   Neurological:     General: No focal deficit present.     Mental Status: She is alert and oriented to person, place, and time.  Psychiatric:        Mood and Affect: Mood normal.        Behavior: Behavior normal.           Assessment & Plan:  Marland KitchenMarland KitchenArasely was seen today for cough.  Diagnoses and all orders for this visit:  Bright red blood per rectum -     Ambulatory referral to Gastroenterology  Sinus drainage -     TSH -     B12 and Folate Panel -     VITAMIN D 25 Hydroxy (Vit-D Deficiency, Fractures) -     COMPLETE METABOLIC PANEL WITH GFR -     CBC with Differential/Platelet -     Sed Rate (ESR) -     C-reactive protein -     fluticasone (FLONASE) 50 MCG/ACT nasal spray; Place 2 sprays into both nostrils daily. -     methylPREDNISolone (MEDROL DOSEPAK) 4 MG TBPK tablet; Take as directed by package insert.  Fatigue, unspecified type -     TSH -     B12 and Folate Panel -     VITAMIN D 25 Hydroxy (Vit-D Deficiency, Fractures) -     COMPLETE METABOLIC PANEL WITH GFR -     CBC with Differential/Platelet -     Sed Rate (ESR) -     C-reactive protein  Choking, initial encounter -     TSH -     B12 and Folate Panel -     VITAMIN D 25 Hydroxy (Vit-D Deficiency, Fractures) -     COMPLETE METABOLIC PANEL WITH GFR -     CBC with Differential/Platelet -     Sed Rate (ESR) -     C-reactive protein -     pantoprazole (PROTONIX) 40 MG tablet; Take 1 tablet (40 mg total) by mouth daily. -     Ambulatory referral to Gastroenterology  Hemorrhoids, unspecified hemorrhoid type -     Ambulatory referral to Gastroenterology   Bright red blood is likely due to hemorrhoids. No colonoscopy in 8 years. Having multiple episodes.  Sent to GI.   Throat clearing and choking sounds like reflux. Start protonix. Follow up in 2 weeks.   Having some sinus issues but does not sound bacterial. Medrol dose pack given.    Having issues with fatigue. Could be more MS flare. Needs follow up  with neurology. Will get some labs.

## 2020-11-14 NOTE — Patient Instructions (Signed)
Start medrol dose pack after labs.  Start protonix for phelgm after eating.  Referral to GI.

## 2020-11-15 ENCOUNTER — Other Ambulatory Visit: Payer: Self-pay | Admitting: Physician Assistant

## 2020-11-15 DIAGNOSIS — M1612 Unilateral primary osteoarthritis, left hip: Secondary | ICD-10-CM

## 2020-11-15 DIAGNOSIS — M7062 Trochanteric bursitis, left hip: Secondary | ICD-10-CM

## 2020-11-15 DIAGNOSIS — M25552 Pain in left hip: Secondary | ICD-10-CM

## 2020-11-15 LAB — B12 AND FOLATE PANEL
Folate: 16.6 ng/mL
Vitamin B-12: 487 pg/mL (ref 200–1100)

## 2020-11-15 LAB — CBC WITH DIFFERENTIAL/PLATELET
Absolute Monocytes: 684 cells/uL (ref 200–950)
Basophils Absolute: 32 cells/uL (ref 0–200)
Basophils Relative: 0.7 %
Eosinophils Absolute: 81 cells/uL (ref 15–500)
Eosinophils Relative: 1.8 %
HCT: 36.9 % (ref 35.0–45.0)
Hemoglobin: 12.1 g/dL (ref 11.7–15.5)
Lymphs Abs: 1517 cells/uL (ref 850–3900)
MCH: 28.1 pg (ref 27.0–33.0)
MCHC: 32.8 g/dL (ref 32.0–36.0)
MCV: 85.8 fL (ref 80.0–100.0)
MPV: 11.5 fL (ref 7.5–12.5)
Monocytes Relative: 15.2 %
Neutro Abs: 2187 cells/uL (ref 1500–7800)
Neutrophils Relative %: 48.6 %
Platelets: 199 10*3/uL (ref 140–400)
RBC: 4.3 10*6/uL (ref 3.80–5.10)
RDW: 11.8 % (ref 11.0–15.0)
Total Lymphocyte: 33.7 %
WBC: 4.5 10*3/uL (ref 3.8–10.8)

## 2020-11-15 LAB — C-REACTIVE PROTEIN: CRP: 2.3 mg/L (ref <8.0)

## 2020-11-15 LAB — COMPLETE METABOLIC PANEL WITH GFR
AG Ratio: 1.3 (calc) (ref 1.0–2.5)
ALT: 18 U/L (ref 6–29)
AST: 20 U/L (ref 10–30)
Albumin: 4.1 g/dL (ref 3.6–5.1)
Alkaline phosphatase (APISO): 41 U/L (ref 31–125)
BUN: 12 mg/dL (ref 7–25)
CO2: 28 mmol/L (ref 20–32)
Calcium: 9.2 mg/dL (ref 8.6–10.2)
Chloride: 102 mmol/L (ref 98–110)
Creat: 0.75 mg/dL (ref 0.50–1.10)
GFR, Est African American: 116 mL/min/{1.73_m2} (ref >=60)
GFR, Est Non African American: 100 mL/min/{1.73_m2} (ref >=60)
Globulin: 3.1 g/dL (calc) (ref 1.9–3.7)
Glucose, Bld: 85 mg/dL (ref 65–99)
Potassium: 3.6 mmol/L (ref 3.5–5.3)
Sodium: 138 mmol/L (ref 135–146)
Total Bilirubin: 0.4 mg/dL (ref 0.2–1.2)
Total Protein: 7.2 g/dL (ref 6.1–8.1)

## 2020-11-15 LAB — SEDIMENTATION RATE: Sed Rate: 6 mm/h (ref 0–20)

## 2020-11-15 LAB — VITAMIN D 25 HYDROXY (VIT D DEFICIENCY, FRACTURES): Vit D, 25-Hydroxy: 51 ng/mL (ref 30–100)

## 2020-11-15 LAB — TSH: TSH: 0.73 mIU/L

## 2020-11-17 NOTE — Progress Notes (Signed)
Sheka,   Normal TSH.  B12 and folate good.  Vitamin D great.  Kidney, liver, glucose looks wonderful.  No anemia.  Inflammation rates are low.

## 2020-11-20 DIAGNOSIS — J3489 Other specified disorders of nose and nasal sinuses: Secondary | ICD-10-CM | POA: Insufficient documentation

## 2020-11-20 DIAGNOSIS — T17308A Unspecified foreign body in larynx causing other injury, initial encounter: Secondary | ICD-10-CM | POA: Insufficient documentation

## 2020-11-20 DIAGNOSIS — K649 Unspecified hemorrhoids: Secondary | ICD-10-CM | POA: Insufficient documentation

## 2020-11-20 DIAGNOSIS — K625 Hemorrhage of anus and rectum: Secondary | ICD-10-CM | POA: Insufficient documentation

## 2020-12-26 ENCOUNTER — Other Ambulatory Visit: Payer: Self-pay | Admitting: Physician Assistant

## 2020-12-26 DIAGNOSIS — M25552 Pain in left hip: Secondary | ICD-10-CM

## 2020-12-26 DIAGNOSIS — M1612 Unilateral primary osteoarthritis, left hip: Secondary | ICD-10-CM

## 2020-12-26 DIAGNOSIS — M7062 Trochanteric bursitis, left hip: Secondary | ICD-10-CM

## 2021-01-13 ENCOUNTER — Ambulatory Visit: Payer: Managed Care, Other (non HMO) | Admitting: Gastroenterology

## 2021-01-15 ENCOUNTER — Encounter: Payer: Self-pay | Admitting: Physician Assistant

## 2021-01-16 NOTE — Telephone Encounter (Signed)
No other suggestions just continue to separate is much as possible hand wash is much as possible and wear her mask if she is going to be around her daughter if her daughter can also wear a mask that is helpful and not sure how old she is.  And if at any point she develops symptoms to please test.

## 2021-02-04 ENCOUNTER — Other Ambulatory Visit: Payer: Self-pay | Admitting: Physician Assistant

## 2021-02-04 DIAGNOSIS — M25552 Pain in left hip: Secondary | ICD-10-CM

## 2021-02-04 DIAGNOSIS — M7062 Trochanteric bursitis, left hip: Secondary | ICD-10-CM

## 2021-02-04 DIAGNOSIS — M1612 Unilateral primary osteoarthritis, left hip: Secondary | ICD-10-CM

## 2021-02-12 ENCOUNTER — Ambulatory Visit: Payer: Managed Care, Other (non HMO) | Admitting: Gastroenterology

## 2021-02-23 ENCOUNTER — Other Ambulatory Visit (HOSPITAL_BASED_OUTPATIENT_CLINIC_OR_DEPARTMENT_OTHER): Payer: Self-pay | Admitting: Physician Assistant

## 2021-02-23 DIAGNOSIS — Z1231 Encounter for screening mammogram for malignant neoplasm of breast: Secondary | ICD-10-CM

## 2021-02-26 ENCOUNTER — Ambulatory Visit (INDEPENDENT_AMBULATORY_CARE_PROVIDER_SITE_OTHER): Payer: Managed Care, Other (non HMO)

## 2021-02-26 ENCOUNTER — Telehealth: Payer: Self-pay | Admitting: Neurology

## 2021-02-26 ENCOUNTER — Other Ambulatory Visit: Payer: Self-pay

## 2021-02-26 DIAGNOSIS — Z1231 Encounter for screening mammogram for malignant neoplasm of breast: Secondary | ICD-10-CM

## 2021-02-26 NOTE — Telephone Encounter (Signed)
Patient has to be recertified to carry a taser for work, but part of certification is being tased unless she has a medical note exempting her. Patient has high blood pressure, MS, and history of PVCs. Note written. Patient aware. She will print from [facility].

## 2021-03-03 NOTE — Progress Notes (Signed)
Normal mammogram. Follow up in 1 year.

## 2021-03-10 ENCOUNTER — Other Ambulatory Visit: Payer: Self-pay | Admitting: Physician Assistant

## 2021-03-10 DIAGNOSIS — M25552 Pain in left hip: Secondary | ICD-10-CM

## 2021-03-10 DIAGNOSIS — M1612 Unilateral primary osteoarthritis, left hip: Secondary | ICD-10-CM

## 2021-03-10 DIAGNOSIS — M7062 Trochanteric bursitis, left hip: Secondary | ICD-10-CM

## 2021-03-20 ENCOUNTER — Ambulatory Visit (INDEPENDENT_AMBULATORY_CARE_PROVIDER_SITE_OTHER): Payer: Managed Care, Other (non HMO) | Admitting: Physician Assistant

## 2021-03-20 DIAGNOSIS — Z5329 Procedure and treatment not carried out because of patient's decision for other reasons: Secondary | ICD-10-CM

## 2021-03-20 NOTE — Progress Notes (Signed)
No show

## 2021-03-25 ENCOUNTER — Encounter: Payer: Managed Care, Other (non HMO) | Admitting: Physician Assistant

## 2021-03-28 ENCOUNTER — Other Ambulatory Visit: Payer: Self-pay | Admitting: Physician Assistant

## 2021-03-28 DIAGNOSIS — I10 Essential (primary) hypertension: Secondary | ICD-10-CM

## 2021-03-31 ENCOUNTER — Encounter: Payer: Managed Care, Other (non HMO) | Admitting: Physician Assistant

## 2021-04-06 ENCOUNTER — Ambulatory Visit: Payer: Managed Care, Other (non HMO) | Admitting: Gastroenterology

## 2021-04-11 ENCOUNTER — Other Ambulatory Visit: Payer: Self-pay | Admitting: Physician Assistant

## 2021-04-11 DIAGNOSIS — M7062 Trochanteric bursitis, left hip: Secondary | ICD-10-CM

## 2021-04-11 DIAGNOSIS — M1612 Unilateral primary osteoarthritis, left hip: Secondary | ICD-10-CM

## 2021-04-11 DIAGNOSIS — M25552 Pain in left hip: Secondary | ICD-10-CM

## 2021-04-13 ENCOUNTER — Ambulatory Visit (INDEPENDENT_AMBULATORY_CARE_PROVIDER_SITE_OTHER): Payer: Self-pay | Admitting: Physician Assistant

## 2021-04-13 DIAGNOSIS — Z91199 Patient's noncompliance with other medical treatment and regimen due to unspecified reason: Secondary | ICD-10-CM

## 2021-04-13 NOTE — Progress Notes (Signed)
No show

## 2021-04-14 ENCOUNTER — Telehealth: Payer: Self-pay | Admitting: Physician Assistant

## 2021-04-14 NOTE — Telephone Encounter (Signed)
Pt lvm 10/18 stating that she thought her appointment was today.

## 2021-04-15 NOTE — Telephone Encounter (Signed)
You're welcome!

## 2021-04-15 NOTE — Telephone Encounter (Signed)
I was able to cancel the appointment since a No Show had not been applied to the visit.  If a No Show had already been applied to the visit, patient would've had to wait until they got the bill.  Once bill is received patient would need to call us. I would email Deirdre, she would contact  Billing to get the No Show removed. This is the process.

## 2021-04-15 NOTE — Telephone Encounter (Signed)
Thank you :)

## 2021-04-27 ENCOUNTER — Encounter: Payer: Managed Care, Other (non HMO) | Admitting: Physician Assistant

## 2021-04-29 ENCOUNTER — Other Ambulatory Visit (HOSPITAL_COMMUNITY)
Admission: RE | Admit: 2021-04-29 | Discharge: 2021-04-29 | Disposition: A | Discharge status: Home / Self Care | Payer: Managed Care, Other (non HMO) | Source: Ambulatory Visit | Attending: Physician Assistant | Admitting: Physician Assistant

## 2021-04-29 ENCOUNTER — Ambulatory Visit (INDEPENDENT_AMBULATORY_CARE_PROVIDER_SITE_OTHER): Payer: Managed Care, Other (non HMO) | Admitting: Physician Assistant

## 2021-04-29 ENCOUNTER — Other Ambulatory Visit: Payer: Self-pay

## 2021-04-29 ENCOUNTER — Encounter: Payer: Self-pay | Admitting: Physician Assistant

## 2021-04-29 VITALS — BP 151/98 | HR 63 | Ht 71.0 in | Wt 248.0 lb

## 2021-04-29 DIAGNOSIS — I10 Essential (primary) hypertension: Secondary | ICD-10-CM | POA: Diagnosis not present

## 2021-04-29 DIAGNOSIS — Z13220 Encounter for screening for lipoid disorders: Secondary | ICD-10-CM

## 2021-04-29 DIAGNOSIS — Z124 Encounter for screening for malignant neoplasm of cervix: Secondary | ICD-10-CM | POA: Diagnosis not present

## 2021-04-29 DIAGNOSIS — T17308D Unspecified foreign body in larynx causing other injury, subsequent encounter: Secondary | ICD-10-CM

## 2021-04-29 DIAGNOSIS — E6609 Other obesity due to excess calories: Secondary | ICD-10-CM

## 2021-04-29 DIAGNOSIS — K649 Unspecified hemorrhoids: Secondary | ICD-10-CM

## 2021-04-29 DIAGNOSIS — Z131 Encounter for screening for diabetes mellitus: Secondary | ICD-10-CM

## 2021-04-29 DIAGNOSIS — Z1159 Encounter for screening for other viral diseases: Secondary | ICD-10-CM

## 2021-04-29 DIAGNOSIS — Z0000 Encounter for general adult medical examination without abnormal findings: Secondary | ICD-10-CM

## 2021-04-29 DIAGNOSIS — Z6834 Body mass index (BMI) 34.0-34.9, adult: Secondary | ICD-10-CM

## 2021-04-29 DIAGNOSIS — G4733 Obstructive sleep apnea (adult) (pediatric): Secondary | ICD-10-CM

## 2021-04-29 DIAGNOSIS — R195 Other fecal abnormalities: Secondary | ICD-10-CM

## 2021-04-29 DIAGNOSIS — Z23 Encounter for immunization: Secondary | ICD-10-CM

## 2021-04-29 LAB — COMPLETE METABOLIC PANEL WITH GFR
AG Ratio: 1.4 (calc) (ref 1.0–2.5)
ALT: 15 U/L (ref 6–29)
AST: 14 U/L (ref 10–30)
Albumin: 4.3 g/dL (ref 3.6–5.1)
Alkaline phosphatase (APISO): 44 U/L (ref 31–125)
BUN: 14 mg/dL (ref 7–25)
CO2: 28 mmol/L (ref 20–32)
Calcium: 9.4 mg/dL (ref 8.6–10.2)
Chloride: 102 mmol/L (ref 98–110)
Creat: 0.82 mg/dL (ref 0.50–0.99)
Globulin: 3.1 g/dL (calc) (ref 1.9–3.7)
Glucose, Bld: 82 mg/dL (ref 65–99)
Potassium: 4.1 mmol/L (ref 3.5–5.3)
Sodium: 138 mmol/L (ref 135–146)
Total Bilirubin: 0.6 mg/dL (ref 0.2–1.2)
Total Protein: 7.4 g/dL (ref 6.1–8.1)
eGFR: 93 mL/min/{1.73_m2} (ref >=60)

## 2021-04-29 LAB — LIPID PANEL W/REFLEX DIRECT LDL
Cholesterol: 196 mg/dL (ref <200)
HDL: 77 mg/dL (ref >=50)
LDL Cholesterol (Calc): 105 mg/dL (calc) — ABNORMAL HIGH
Non-HDL Cholesterol (Calc): 119 mg/dL (calc) (ref <130)
Total CHOL/HDL Ratio: 2.5 (calc) (ref <5.0)
Triglycerides: 54 mg/dL (ref <150)

## 2021-04-29 LAB — HEPATITIS C ANTIBODY
Hepatitis C Ab: NONREACTIVE
SIGNAL TO CUT-OFF: 0.06 (ref <1.00)

## 2021-04-29 MED ORDER — TRIAMTERENE-HCTZ 37.5-25 MG PO CAPS: 1.0000 | ORAL_CAPSULE | Freq: Every day | ORAL | 3 refills | Status: DC

## 2021-04-29 MED ORDER — METOPROLOL SUCCINATE ER 50 MG PO TB24: 50.0000 mg | ORAL_TABLET | Freq: Every day | ORAL | 3 refills | Status: DC

## 2021-04-29 NOTE — Progress Notes (Signed)
Subjective:     Jean Yu is a 40 y.o. female and is here for a comprehensive physical exam. The patient reports no problems. Social History   Socioeconomic History   Marital status: Single    Spouse name: Not on file   Number of children: Not on file   Years of education: Not on file   Highest education level: Not on file  Occupational History   Not on file  Tobacco Use   Smoking status: Former   Smokeless tobacco: Never  Vaping Use   Vaping Use: Never used  Substance and Sexual Activity   Alcohol use: Yes    Alcohol/week: 0.0 standard drinks    Comment: Rarely   Drug use: No   Sexual activity: Not Currently  Other Topics Concern   Not on file  Social History Narrative   Not on file   Social Determinants of Health   Financial Resource Strain: Not on file  Food Insecurity: Not on file  Transportation Needs: Not on file  Physical Activity: Not on file  Stress: Not on file  Social Connections: Not on file  Intimate Partner Violence: Not on file   Health Maintenance  Topic Date Due   PAP SMEAR-Modifier  04/24/2021   COVID-19 Vaccine (3 - Moderna risk series) 05/15/2021 (Originally 12/24/2020)   HIV Screening  06/19/2021 (Originally 12/23/1995)   MAMMOGRAM  02/26/2022   TETANUS/TDAP  10/31/2024   INFLUENZA VACCINE  Completed   Hepatitis C Screening  Completed   HPV VACCINES  Aged Out   Pneumococcal Vaccine 32-73 Years old  Discontinued    The following portions of the patient's history were reviewed and updated as appropriate: allergies, current medications, past family history, past medical history, past social history, past surgical history, and problem list.  Review of Systems A comprehensive review of systems was negative.   Objective:    BP (!) 151/98   Pulse 63   Ht 5\' 11"  (1.803 m)   Wt 248 lb (112.5 kg)   SpO2 99%   BMI 34.59 kg/m  General appearance: alert, cooperative, appears stated age, and morbidly obese Head: Normocephalic,  without obvious abnormality, atraumatic Eyes: conjunctivae/corneas clear. PERRL, EOM's intact. Fundi benign. Ears: normal TM's and external ear canals both ears Nose: Nares normal. Septum midline. Mucosa normal. No drainage or sinus tenderness. Throat: lips, mucosa, and tongue normal; teeth and gums normal Neck: no adenopathy, no carotid bruit, no JVD, supple, symmetrical, trachea midline, and thyroid not enlarged, symmetric, no tenderness/mass/nodules Back: symmetric, no curvature. ROM normal. No CVA tenderness. Lungs: clear to auscultation bilaterally Heart: regular rate and rhythm, S1, S2 normal, no murmur, click, rub or gallop Abdomen: soft, non-tender; bowel sounds normal; no masses,  no organomegaly Pulses: 2+ and symmetric Skin: Skin color, texture, turgor normal. No rashes or lesions Lymph nodes: Cervical, supraclavicular, and axillary nodes normal. Neurologic: Alert and oriented X 3, normal strength and tone. Normal symmetric reflexes. Normal coordination and gait   . Depression screen [facility] 2/9 04/29/2021 05/08/2019 04/24/2018 08/29/2017 03/29/2017  Decreased Interest 0 0 0 0 0  Down, Depressed, Hopeless 0 0 0 0 0  PHQ - 2 Score 0 0 0 0 0  Altered sleeping 0 1 0 0 -  Tired, decreased energy 1 0 0 1 -  Change in appetite 0 0 0 0 -  Feeling bad or failure about yourself  0 0 0 0 -  Trouble concentrating 0 0 0 0 -  Moving slowly or fidgety/restless 0  0 0 0 -  Suicidal thoughts 0 0 0 0 -  PHQ-9 Score 1 1 0 1 -  Difficult doing work/chores Somewhat difficult Not difficult at all Not difficult at all Somewhat difficult -   .. GAD 7 : Generalized Anxiety Score 04/29/2021 05/08/2019 04/24/2018  Nervous, Anxious, on Edge 0 0 0  Control/stop worrying 0 0 0  Worry too much - different things 0 0 0  Trouble relaxing 0 0 0  Restless 0 0 0  Easily annoyed or irritable 0 0 0  Afraid - awful might happen 0 0 0  Total GAD 7 Score 0 0 0  Anxiety Difficulty Not difficult at all Not difficult  at all Not difficult at all      Assessment:    Healthy female exam.      Plan:    Marland KitchenMarland KitchenCelisa was seen today for annual exam.  Diagnoses and all orders for this visit:  Routine physical examination -     Lipid Panel w/reflex Direct LDL -     COMPLETE METABOLIC PANEL WITH GFR -     Hepatitis C Antibody  Papanicolaou smear -     Cytology - PAP  Flu vaccine need -     Flu Vaccine QUAD 53mo+IM (Fluarix, Fluzone & Alfiuria Quad PF)  Essential hypertension -     metoprolol succinate (TOPROL-XL) 50 MG 24 hr tablet; Take 1 tablet (50 mg total) by mouth daily. -     triamterene-hydrochlorothiazide (DYAZIDE) 37.5-25 MG capsule; Take 1 each (1 capsule total) by mouth daily.  Screening for lipid disorders -     Lipid Panel w/reflex Direct LDL  Screening for diabetes mellitus -     COMPLETE METABOLIC PANEL WITH GFR  Encounter for hepatitis C screening test for low risk patient -     Hepatitis C Antibody  Primary hypertension  Obstructive sleep apnea  Class 1 obesity due to excess calories without serious comorbidity with body mass index (BMI) of 34.0 to 34.9 in adult  .[name] Discussed 150 minutes of exercise a week.  Encouraged vitamin D 1000 units and Calcium 1300mg  or 4 servings of dairy a day.  PHQ/GAD no concerns.  Fasting labs ordered today.  OSA-using CPAP. BP up today. Start checking at home. Discussed low salt diet and regular exercise.  Mammogram UTD.  Pap done today.  Follow up in 2 months.  Covid vaccine x4. Flu shot given today.   Needs another GI referral. Hemorrhoids/bright red blood and choking.   See After Visit Summary for Counseling Recommendations

## 2021-04-29 NOTE — Patient Instructions (Signed)
Health Maintenance, Female Adopting a healthy lifestyle and getting preventive care are important in promoting health and wellness. Ask your health care provider about: The right schedule for you to have regular tests and exams. Things you can do on your own to prevent diseases and keep yourself healthy. What should I know about diet, weight, and exercise? Eat a healthy diet  Eat a diet that includes plenty of vegetables, fruits, low-fat dairy products, and lean protein. Do not eat a lot of foods that are high in solid fats, added sugars, or sodium. Maintain a healthy weight Body mass index (BMI) is used to identify weight problems. It estimates body fat based on height and weight. Your health care provider can help determine your BMI and help you achieve or maintain a healthy weight. Get regular exercise Get regular exercise. This is one of the most important things you can do for your health. Most adults should: Exercise for at least 150 minutes each week. The exercise should increase your heart rate and make you sweat (moderate-intensity exercise). Do strengthening exercises at least twice a week. This is in addition to the moderate-intensity exercise. Spend less time sitting. Even light physical activity can be beneficial. Watch cholesterol and blood lipids Have your blood tested for lipids and cholesterol at 40 years of age, then have this test every 5 years. Have your cholesterol levels checked more often if: Your lipid or cholesterol levels are high. You are older than 40 years of age. You are at high risk for heart disease. What should I know about cancer screening? Depending on your health history and family history, you may need to have cancer screening at various ages. This may include screening for: Breast cancer. Cervical cancer. Colorectal cancer. Skin cancer. Lung cancer. What should I know about heart disease, diabetes, and high blood pressure? Blood pressure and heart  disease High blood pressure causes heart disease and increases the risk of stroke. This is more likely to develop in people who have high blood pressure readings, are of African descent, or are overweight. Have your blood pressure checked: Every 3-5 years if you are 18-39 years of age. Every year if you are 40 years old or older. Diabetes Have regular diabetes screenings. This checks your fasting blood sugar level. Have the screening done: Once every three years after age 40 if you are at a normal weight and have a low risk for diabetes. More often and at a younger age if you are overweight or have a high risk for diabetes. What should I know about preventing infection? Hepatitis B If you have a higher risk for hepatitis B, you should be screened for this virus. Talk with your health care provider to find out if you are at risk for hepatitis B infection. Hepatitis C Testing is recommended for: Everyone born from 1945 through 1965. Anyone with known risk factors for hepatitis C. Sexually transmitted infections (STIs) Get screened for STIs, including gonorrhea and chlamydia, if: You are sexually active and are younger than 40 years of age. You are older than 40 years of age and your health care provider tells you that you are at risk for this type of infection. Your sexual activity has changed since you were last screened, and you are at increased risk for chlamydia or gonorrhea. Ask your health care provider if you are at risk. Ask your health care provider about whether you are at high risk for HIV. Your health care provider may recommend a prescription medicine   to help prevent HIV infection. If you choose to take medicine to prevent HIV, you should first get tested for HIV. You should then be tested every 3 months for as long as you are taking the medicine. Pregnancy If you are about to stop having your period (premenopausal) and you may become pregnant, seek counseling before you get  pregnant. Take 400 to 800 micrograms (mcg) of folic acid every day if you become pregnant. Ask for birth control (contraception) if you want to prevent pregnancy. Osteoporosis and menopause Osteoporosis is a disease in which the bones lose minerals and strength with aging. This can result in bone fractures. If you are 65 years old or older, or if you are at risk for osteoporosis and fractures, ask your health care provider if you should: Be screened for bone loss. Take a calcium or vitamin D supplement to lower your risk of fractures. Be given hormone replacement therapy (HRT) to treat symptoms of menopause. Follow these instructions at home: Lifestyle Do not use any products that contain nicotine or tobacco, such as cigarettes, e-cigarettes, and chewing tobacco. If you need help quitting, ask your health care provider. Do not use street drugs. Do not share needles. Ask your health care provider for help if you need support or information about quitting drugs. Alcohol use Do not drink alcohol if: Your health care provider tells you not to drink. You are pregnant, may be pregnant, or are planning to become pregnant. If you drink alcohol: Limit how much you use to 0-1 drink a day. Limit intake if you are breastfeeding. Be aware of how much alcohol is in your drink. In the [place] one drink equals one 12 oz bottle of beer (355 mL), one 5 oz glass of wine (148 mL), or one 1 oz glass of hard liquor (44 mL). General instructions Schedule regular health, dental, and eye exams. Stay current with your vaccines. Tell your health care provider if: You often feel depressed. You have ever been abused or do not feel safe at home. Summary Adopting a healthy lifestyle and getting preventive care are important in promoting health and wellness. Follow your health care provider's instructions about healthy diet, exercising, and getting tested or screened for diseases. Follow your health care provider's  instructions on monitoring your cholesterol and blood pressure. This information is not intended to replace advice given to you by your health care provider. Make sure you discuss any questions you have with your health care provider. Document Revised: 08/22/2020 Document Reviewed: 06/07/2018 Elsevier Patient Education  2022 Elsevier Inc.  

## 2021-04-30 NOTE — Progress Notes (Signed)
Jean Yu,   Your HDL, good cholesterol, looks amazing.  Your LDL, bad cholesterol, is just a little off optimal.  Kidney, liver, glucose look great.  Labs look great.

## 2021-05-01 ENCOUNTER — Encounter: Payer: Self-pay | Admitting: Physician Assistant

## 2021-05-04 LAB — CYTOLOGY - PAP
Chlamydia: NEGATIVE
Comment: NEGATIVE
Comment: NEGATIVE
Comment: NORMAL
Diagnosis: NEGATIVE
Diagnosis: REACTIVE
High risk HPV: NEGATIVE
Neisseria Gonorrhea: NEGATIVE

## 2021-05-04 NOTE — Progress Notes (Signed)
Normal cells. No HPV. No STIs. Next pap 5 years.

## 2021-05-09 ENCOUNTER — Other Ambulatory Visit: Payer: Self-pay | Admitting: Physician Assistant

## 2021-05-09 DIAGNOSIS — M25552 Pain in left hip: Secondary | ICD-10-CM

## 2021-05-09 DIAGNOSIS — M7062 Trochanteric bursitis, left hip: Secondary | ICD-10-CM

## 2021-05-09 DIAGNOSIS — M1612 Unilateral primary osteoarthritis, left hip: Secondary | ICD-10-CM

## 2021-06-06 ENCOUNTER — Other Ambulatory Visit: Payer: Self-pay | Admitting: Physician Assistant

## 2021-06-06 DIAGNOSIS — M25552 Pain in left hip: Secondary | ICD-10-CM

## 2021-06-06 DIAGNOSIS — M7062 Trochanteric bursitis, left hip: Secondary | ICD-10-CM

## 2021-06-06 DIAGNOSIS — M1612 Unilateral primary osteoarthritis, left hip: Secondary | ICD-10-CM

## 2021-06-30 ENCOUNTER — Ambulatory Visit: Payer: Managed Care, Other (non HMO) | Admitting: Physician Assistant

## 2021-07-07 ENCOUNTER — Other Ambulatory Visit: Payer: Self-pay | Admitting: Physician Assistant

## 2021-07-07 DIAGNOSIS — M7062 Trochanteric bursitis, left hip: Secondary | ICD-10-CM

## 2021-07-07 DIAGNOSIS — M1612 Unilateral primary osteoarthritis, left hip: Secondary | ICD-10-CM

## 2021-07-07 DIAGNOSIS — M25552 Pain in left hip: Secondary | ICD-10-CM

## 2021-07-10 ENCOUNTER — Ambulatory Visit: Payer: Managed Care, Other (non HMO) | Admitting: Physician Assistant

## 2021-07-14 ENCOUNTER — Ambulatory Visit: Payer: Managed Care, Other (non HMO) | Admitting: Physician Assistant

## 2021-07-16 ENCOUNTER — Other Ambulatory Visit: Payer: Self-pay

## 2021-07-16 ENCOUNTER — Ambulatory Visit (INDEPENDENT_AMBULATORY_CARE_PROVIDER_SITE_OTHER): Payer: Managed Care, Other (non HMO) | Admitting: Family Medicine

## 2021-07-16 ENCOUNTER — Telehealth: Payer: Self-pay | Admitting: Physician Assistant

## 2021-07-16 VITALS — BP 127/85 | HR 67 | Resp 20 | Ht 71.0 in | Wt 248.0 lb

## 2021-07-16 DIAGNOSIS — R5383 Other fatigue: Secondary | ICD-10-CM | POA: Diagnosis not present

## 2021-07-16 DIAGNOSIS — I10 Essential (primary) hypertension: Secondary | ICD-10-CM | POA: Diagnosis not present

## 2021-07-16 MED ORDER — METOPROLOL SUCCINATE ER 100 MG PO TB24: 100.0000 mg | ORAL_TABLET | Freq: Every day | ORAL | 0 refills | Status: DC

## 2021-07-16 NOTE — Progress Notes (Signed)
First BP 146/92 Second BP 127/85  Pt would like to try an increased dose of metoprolol. Please send to [facility], [place]. Pt has upcoming appt with PCP on 07/21/21.

## 2021-07-16 NOTE — Telephone Encounter (Signed)
Pt called concerned about her elevate BP. She is asking if she should increase any of her BP meds. She is going home now to leave since her pressure is so high.

## 2021-07-16 NOTE — Telephone Encounter (Signed)
Patient scheduled.

## 2021-07-16 NOTE — Progress Notes (Signed)
She had actually called earlier today she had an appoint with [facility] specialist and while there her blood pressure was 182/127.  Her only symptom was having a little bit of blurry vision but she felt like that was because she was having issues with her contacts but otherwise she denied any headaches, chest pain, shortness of breath or weakness.  We had asked her to come in today for a nurse visit so that we could recheck her blood pressure.  Repeat blood pressure looks much better here in the office this afternoon.  Looking back though her diastolics have always been a little elevated and borderline so we did discuss options of either adding a low-dose of amlodipine to target the diastolic pressure or considering increasing her metoprolol.  She would like to try increasing her metoprolol metoprolol.  We will increase to 100 mg daily encouraged her to follow-up in 1 week with her PCP.

## 2021-07-16 NOTE — Telephone Encounter (Signed)
Can we triage and see how high blood pressure has been? Patient has cancelled last three appts, needs to keep visit to discuss meds.

## 2021-07-16 NOTE — Progress Notes (Signed)
Established Patient Office Visit  Subjective:  Patient ID: Jean Yu, female    DOB: 1980/11/24  Age: 41 y.o. MRN: [id number]  CC:  Chief Complaint  Patient presents with   Hypertension    HPI Neviah Braud presents for a BP check. Pt denies chest pain, shortness of breath, or heart palpitations.  Taking meds as directed w/o problems. Denies medication side effects.   Past Medical History:  Diagnosis Date   Borderline high cholesterol    Hypertension    MS (multiple sclerosis) (HCC) 12/09/2005   Obesity    OSA (obstructive sleep apnea)     Past Surgical History:  Procedure Laterality Date   No prior surgery      Family History  Problem Relation Age of Onset   Hypertension Mother    Heart attack Mother    Cancer Maternal Aunt     Social History   Socioeconomic History   Marital status: Single    Spouse name: Not on file   Number of children: Not on file   Years of education: Not on file   Highest education level: Not on file  Occupational History   Not on file  Tobacco Use   Smoking status: Former   Smokeless tobacco: Never  Vaping Use   Vaping Use: Never used  Substance and Sexual Activity   Alcohol use: Yes    Alcohol/week: 0.0 standard drinks    Comment: Rarely   Drug use: No   Sexual activity: Not Currently  Other Topics Concern   Not on file  Social History Narrative   Not on file   Social Determinants of Health   Financial Resource Strain: Not on file  Food Insecurity: Not on file  Transportation Needs: Not on file  Physical Activity: Not on file  Stress: Not on file  Social Connections: Not on file  Intimate Partner Violence: Not on file    ROS Review of Systems  Objective:   Today's Vitals: BP 127/85 (BP Location: Left Arm, Cuff Size: Large)    Pulse 67    Resp 20    Ht 5\' 11"  (1.803 m)    Wt 248 lb (112.5 kg)    SpO2 98%    BMI 34.59 kg/m   Physical Exam  Assessment & Plan:  First BP 146/92, after 10  minutes BP is 127/85. Pt also requests a Covid test. Pt swabbed and test taken out to box for pickup.  Problem List Items Addressed This Visit       Other   Fatigue   Relevant Orders   Novel Coronavirus, NAA ([facility])   Other Visit Diagnoses     Essential hypertension    -  Primary       Outpatient Encounter Medications as of 07/16/2021  Medication Sig   diclofenac Sodium (VOLTAREN) 1 % GEL APPLY 4 GRAMS TOPICALLY 4 TIMES DAILY TO AFFECTED JOINT   Ascorbic Acid (VITAMIN C) 1000 MG tablet Take 1,000 mg by mouth daily.   cholecalciferol (VITAMIN D) 1000 units tablet Take 1,000 Units by mouth daily.   metoprolol succinate (TOPROL-XL) 50 MG 24 hr tablet Take 1 tablet (50 mg total) by mouth daily.   Multiple Vitamins-Minerals (ZINC PO) Take by mouth daily.   triamterene-hydrochlorothiazide (DYAZIDE) 37.5-25 MG capsule Take 1 each (1 capsule total) by mouth daily.   Turmeric 500 MG CAPS Take 500 mg by mouth in the morning and at bedtime.   vitamin B-12 (CYANOCOBALAMIN) 1000 MCG tablet Take by  mouth.   No facility-administered encounter medications on file as of 07/16/2021.    Follow-up: Return in about 5 days (around 07/21/2021) for office visit with PCP.[name]   Kathrynn Speed, CMA

## 2021-07-16 NOTE — Telephone Encounter (Signed)
Patient states that her blood pressure this morning was 182/127 at her appt with [facility].  Pt c/o fatigue, legs and hips are hurting, but she has history of hip problems.  Pt denies headache, SOB, chest pain, or weakness.  Pt does state that she is having some blurred vision but is having issues with her contacts.  Please advise.  Jean Yu, CMA

## 2021-07-16 NOTE — Telephone Encounter (Signed)
LVM for pt to call to discuss.  T. Carmen Tolliver, CMA  

## 2021-07-17 LAB — SARS-COV-2, NAA 2 DAY TAT

## 2021-07-17 LAB — NOVEL CORONAVIRUS, NAA: SARS-CoV-2, NAA: NOT DETECTED

## 2021-07-17 NOTE — Progress Notes (Signed)
Great news!,  You are negative for COVID

## 2021-07-20 ENCOUNTER — Telehealth: Payer: Self-pay | Admitting: General Practice

## 2021-07-20 NOTE — Telephone Encounter (Signed)
Transition Care Management Unsuccessful Follow-up Telephone Call  Date of discharge and from where:  07/18/21 from [facility]  Attempts:  1st Attempt  Reason for unsuccessful TCM follow-up call:  Left voice message

## 2021-07-21 ENCOUNTER — Other Ambulatory Visit: Payer: Self-pay

## 2021-07-21 ENCOUNTER — Encounter: Payer: Self-pay | Admitting: Physician Assistant

## 2021-07-21 ENCOUNTER — Ambulatory Visit: Payer: Managed Care, Other (non HMO) | Admitting: Physician Assistant

## 2021-07-21 VITALS — BP 123/90 | HR 75 | Ht 71.0 in | Wt 244.0 lb

## 2021-07-21 DIAGNOSIS — I10 Essential (primary) hypertension: Secondary | ICD-10-CM

## 2021-07-21 DIAGNOSIS — M21611 Bunion of right foot: Secondary | ICD-10-CM

## 2021-07-21 DIAGNOSIS — M25552 Pain in left hip: Secondary | ICD-10-CM

## 2021-07-21 DIAGNOSIS — M1612 Unilateral primary osteoarthritis, left hip: Secondary | ICD-10-CM | POA: Diagnosis not present

## 2021-07-21 DIAGNOSIS — K219 Gastro-esophageal reflux disease without esophagitis: Secondary | ICD-10-CM

## 2021-07-21 DIAGNOSIS — R079 Chest pain, unspecified: Secondary | ICD-10-CM

## 2021-07-21 DIAGNOSIS — M21612 Bunion of left foot: Secondary | ICD-10-CM | POA: Insufficient documentation

## 2021-07-21 DIAGNOSIS — Z8241 Family history of sudden cardiac death: Secondary | ICD-10-CM

## 2021-07-21 MED ORDER — DICLOFENAC SODIUM 1 % EX GEL: CUTANEOUS | 1 refills | Status: DC

## 2021-07-21 MED ORDER — TRAMADOL HCL 50 MG PO TABS: 50.0000 mg | ORAL_TABLET | Freq: Three times a day (TID) | ORAL | 0 refills | Status: DC | PRN

## 2021-07-21 NOTE — Progress Notes (Signed)
Subjective:    Patient ID: Jean Yu, female    DOB: 1981/02/12, 41 y.o.   MRN: [id number]  HPI Pt is a 41 yo female who presents to the clinic to follow up on HTN after ED visit on 1/21 for CP. Pt was at [employer] walking and a shooting pain started in her left chest and radiated down into left chest wall. BP at home per patient been 200 over 100. Once she felt some palpitations while she was walking TV. No vision changes or headaches. She is taking diazide and metoprolol. ED EKG unremarkable/CTA negative/Cardiac enzymes negative.   Pt continues to have lots of pain in her left hip due to OA. Failed injections. Cannot take oral NSAIDs. She is using voltaren gel. She is only using once a day because scared it is going to run out. Pain is really getting to her. Hard to sleep at night. Hard to stand or sit for long periods of time. She has appt feb 1st to have another talk with ortho.   Pt has bilateral bunions and wants referral to discuss removal.   .. Active Ambulatory Problems    Diagnosis Date Noted   Primary hypertension 12/18/2008   MS (multiple sclerosis) (HCC) 12/09/2005   BMI 33.0-33.9,adult 03/29/2017   Obstructive sleep apnea 05/27/17   Family history of sudden cardiac death in mother May 27, 2017   Chest pain with low risk of acute coronary syndrome 27-May-2017   Large breasts 08/29/2017   Class 1 obesity due to excess calories without serious comorbidity with body mass index (BMI) of 34.0 to 34.9 in adult 08/29/2017   Left arm pain 05/11/2019   Palpitations 05/11/2019   Breast skin changes 05/11/2019   Breast tenderness in female 05/11/2019   Osteoarthritis of left hip 01/09/2020   Acute reaction to stress 02/20/2020   Carpal tunnel syndrome 09/18/2015   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   Circadian rhythm sleep disorder, 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   Degenerative disc disease, lumbar 08/23/2019   Fatigue 09/18/2015   High  risk medication use 11/16/2017   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   Left leg pain 02/17/2018   Memory loss 09/18/2015   Numbness and tingling in both hands 09/18/2015   Oligomenorrhea 04/29/2014   Premature ventricular contractions (PVCs) (VPCs) 02/20/2020   Unsteady gait 09/18/2015   Vitamin D insufficiency 01/01/2013   Acute intractable headache 02/27/2020   Primary osteoarthritis of left hip 03/28/2020   Hypotension due to drugs 07/10/2020   Anemia 07/15/2020   Hypokalemia 07/15/2020   Right-sided chest pain 07/15/2020   Burping 07/15/2020   Indigestion 07/15/2020   Left hip pain 09/16/2020   Hemorrhoids 11/20/2020   Choking 11/20/2020   Sinus drainage 11/20/2020   Bunion of great toe of left foot 07/21/2021   Bunion of great toe of right foot 07/21/2021   Left-sided chest pain 07/21/2021   Resolved Ambulatory Problems    Diagnosis Date Noted   PHLEBITIS&THROMBOPHLEB SUP VESSELS LOWER EXTREM 12/18/2008   CRAMP IN LIMB 12/18/2008   EDEMA LEG 12/18/2008   Bright red blood per rectum 11/20/2020   Past Medical History:  Diagnosis Date   Borderline high cholesterol    Hypertension    Obesity    OSA (obstructive sleep apnea)       Review of Systems    See HPI.  Objective:   Physical Exam Vitals reviewed.  Constitutional:      Appearance: Normal appearance. She is  obese.  HENT:     Head: Normocephalic.  Neck:     Vascular: No carotid bruit.  Cardiovascular:     Rate and Rhythm: Normal rate and regular rhythm.     Pulses: Normal pulses.     Heart sounds: Normal heart sounds.  Pulmonary:     Effort: Pulmonary effort is normal.     Breath sounds: Normal breath sounds.  Chest:     Chest wall: No tenderness.  Musculoskeletal:     Right lower leg: No edema.     Left lower leg: No edema.  Neurological:     General: No focal deficit present.     Mental Status: She is alert and oriented to person, place, and time.  Psychiatric:        Mood  and Affect: Mood normal.        Behavior: Behavior normal.          Assessment & Plan:  Marland KitchenMarland KitchenSadi was seen today for hypertension.  Diagnoses and all orders for this visit:  Essential hypertension -     Exercise Tolerance Test; Future -     Cardiac Stress Test: Informed Consent Details: Physician/Practitioner Attestation; Transcribe to consent form and obtain patient signature  Left hip pain -     diclofenac Sodium (VOLTAREN) 1 % GEL; APPLY 4 GRAMS TOPICALLY 4 TIMES DAILY TO AFFECTED JOINT -     traMADol (ULTRAM) 50 MG tablet; Take 1 tablet (50 mg total) by mouth every 8 (eight) hours as needed for moderate pain.  Primary osteoarthritis of left hip -     diclofenac Sodium (VOLTAREN) 1 % GEL; APPLY 4 GRAMS TOPICALLY 4 TIMES DAILY TO AFFECTED JOINT -     traMADol (ULTRAM) 50 MG tablet; Take 1 tablet (50 mg total) by mouth every 8 (eight) hours as needed for moderate pain.  Family history of sudden cardiac death in mother -     Exercise Tolerance Test; Future  Bunion of great toe of left foot -     Ambulatory referral to Podiatry  Bunion of great toe of right foot -     Ambulatory referral to Podiatry  Gastroesophageal reflux disease without esophagitis  Left-sided chest pain -     Exercise Tolerance Test; Future -     Cardiac Stress Test: Informed Consent Details: Physician/Practitioner Attestation; Transcribe to consent form and obtain patient signature   BP much better in office today.  Bring BP cuff from home in to make sure registering the same Due to family hx and chest pain will get stress test Continue same BP medications  Voltaren gel increase to 4 times a day. Tramadol for break through pain .Marland KitchenPDMP reviewed during this encounter. Will make follow up with ortho for left hip pain due to OA.   Request referral for bunions to consider surgery.

## 2021-07-21 NOTE — Patient Instructions (Addendum)
Will get stress test Continue on same medication Referral to podiatrist  Volatren refilled Tramadol as needed for pain

## 2021-07-22 NOTE — Telephone Encounter (Signed)
Transition Care Management Unsuccessful Follow-up Telephone Call  Date of discharge and from where:  07/18/21 from [facility]  Attempts:  2nd Attempt  Reason for unsuccessful TCM follow-up call:  Left voice message

## 2021-07-24 ENCOUNTER — Telehealth (HOSPITAL_COMMUNITY): Payer: Self-pay | Admitting: *Deleted

## 2021-07-24 NOTE — Telephone Encounter (Signed)
Transition Care Management Follow-up Telephone Call Date of discharge and from where: 07/18/21 from [facility] How have you been since you were released from the hospital? Had OV with Tandy Gaw on 07/21/21. Any questions or concerns? No

## 2021-07-24 NOTE — Telephone Encounter (Signed)
Close encounter 

## 2021-07-28 ENCOUNTER — Inpatient Hospital Stay (HOSPITAL_COMMUNITY): Admission: RE | Admit: 2021-07-28 | Payer: Managed Care, Other (non HMO) | Source: Ambulatory Visit

## 2021-07-29 ENCOUNTER — Telehealth (HOSPITAL_COMMUNITY): Payer: Self-pay | Admitting: *Deleted

## 2021-07-29 NOTE — Telephone Encounter (Signed)
Close encounter 

## 2021-07-30 ENCOUNTER — Inpatient Hospital Stay (HOSPITAL_COMMUNITY): Admission: RE | Admit: 2021-07-30 | Payer: Managed Care, Other (non HMO) | Source: Ambulatory Visit

## 2021-07-30 ENCOUNTER — Telehealth (HOSPITAL_COMMUNITY): Payer: Self-pay | Admitting: Physician Assistant

## 2021-07-30 ENCOUNTER — Encounter (HOSPITAL_COMMUNITY): Payer: Self-pay

## 2021-07-30 NOTE — Telephone Encounter (Signed)
Patient called and cancelled GXT for reason below:  07/30/2021 8:21 AM XB:[id number], YVETTE  Cancel Rsn: Patient (not feeling well, will call back to reschedule)  Order will be removed form the WQ and if patient calls back to reschedule we will reinstate the order.

## 2021-08-06 ENCOUNTER — Encounter: Payer: Self-pay | Admitting: Podiatry

## 2021-08-06 ENCOUNTER — Other Ambulatory Visit: Payer: Self-pay

## 2021-08-06 ENCOUNTER — Ambulatory Visit (INDEPENDENT_AMBULATORY_CARE_PROVIDER_SITE_OTHER): Payer: Managed Care, Other (non HMO)

## 2021-08-06 ENCOUNTER — Ambulatory Visit: Payer: Managed Care, Other (non HMO) | Admitting: Podiatry

## 2021-08-06 DIAGNOSIS — M21612 Bunion of left foot: Secondary | ICD-10-CM

## 2021-08-06 DIAGNOSIS — L603 Nail dystrophy: Secondary | ICD-10-CM | POA: Diagnosis not present

## 2021-08-06 DIAGNOSIS — M21611 Bunion of right foot: Secondary | ICD-10-CM | POA: Diagnosis not present

## 2021-08-06 DIAGNOSIS — M21619 Bunion of unspecified foot: Secondary | ICD-10-CM

## 2021-08-06 NOTE — Progress Notes (Signed)
°  Subjective:  Patient ID: Jean Yu, female    DOB: 23-Aug-1980,   MRN: [id number]  Chief Complaint  Patient presents with   Bunions    bilateral bunions   Nail Problem     Bilateral great toe nail are not healing from acrylic nails    41 y.o. female presents for concern of bilateral bunions she has been dealing with for years. Relates they do hurt and she has tried padding and different shoes Also has concern for bilateral great toenail and fungus.. Denies any other pedal complaints. Denies n/v/f/c.   Past Medical History:  Diagnosis Date   Borderline high cholesterol    Hypertension    MS (multiple sclerosis) (HCC) 12/09/2005   Obesity    OSA (obstructive sleep apnea)     Objective:  Physical Exam: Vascular: DP/PT pulses 2/4 bilateral. CFT <3 seconds. Normal hair growth on digits. No edema.  Skin. No lacerations or abrasions bilateral feet. Bilateral hallux nails thickened and discolored with subungual debris Musculoskeletal: MMT 5/5 bilateral lower extremities in DF, PF, Inversion and Eversion. Deceased ROM in DF of ankle joint. Moderate HAV deformity noted bilateral Left worse than right.  Neurological: Sensation intact to light touch.   Assessment:   1. Bilateral bunions   2. Onychodystrophy      Plan:  Patient was evaluated and treated and all questions answered. X-rays reviewed and discussed with patient. No acute fractures or dislocations noted. Moderate HAV deformity noted bilateral.  -Discussed HAV and treatment options;conservative and surgical management; risks, benefits, alternatives discussed. All patient's questions answered. -Discussed padding and wide shoe gear.   -Recommend continue with good supportive shoes and inserts.  -Discussed surgical options. Patient will plan on getting hip fixed first and then will discuss further surgery.  -Discussed treatment options for painful dystrophic nails  -Discussed removal of bilateral great toenails.  Patient would like to schedule for a Friday to have them removed either temporarily or permanently.  -Patient to return next Friday.      Louann Sjogren, DPM

## 2021-08-14 ENCOUNTER — Ambulatory Visit: Payer: Managed Care, Other (non HMO) | Admitting: Podiatry

## 2021-08-21 ENCOUNTER — Ambulatory Visit: Payer: Managed Care, Other (non HMO) | Admitting: Podiatry

## 2021-09-04 ENCOUNTER — Ambulatory Visit: Payer: Managed Care, Other (non HMO) | Admitting: Podiatry

## 2021-09-15 ENCOUNTER — Telehealth: Payer: Self-pay

## 2021-09-15 NOTE — Telephone Encounter (Signed)
Initiated Prior authorization TJQ:[id number] Sodium 1% gel ?Via: Covermymeds ?Case/Key:[id number]  ?Status: approved as of 09/15/21 ?Reason:;Coverage Start Date:08/16/2021;Coverage End Date:09/15/2022; ?Notified Pt via: Mychart ?

## 2021-10-06 ENCOUNTER — Other Ambulatory Visit: Payer: Self-pay | Admitting: Family Medicine

## 2021-10-06 DIAGNOSIS — I10 Essential (primary) hypertension: Secondary | ICD-10-CM

## 2021-10-19 ENCOUNTER — Telehealth: Payer: Self-pay | Admitting: General Practice

## 2021-10-19 NOTE — Telephone Encounter (Signed)
Transition Care Management Follow-up Telephone Call ?Date of discharge and from where: 10/18/21 from [facility] ?How have you been since you were released from the hospital? She is doing ok. Still has pain in her incision area. She started her antibiotics.  ?Any questions or concerns? No ? ?Items Reviewed: ?Did the pt receive and understand the discharge instructions provided? Yes  ?Medications obtained and verified? Yes  ?Other? No  ?Any new allergies since your discharge? No  ?Dietary orders reviewed? Yes ?Do you have support at home? Yes  ? ?Home Care and Equipment/Supplies: ?Were home health services ordered? no ? ?Functional Questionnaire: (I = Independent and D = Dependent) ?ADLs: I ? ?Bathing/Dressing- I ? ?Meal Prep- I ? ?Eating- I ? ?Maintaining continence- I ? ?Transferring/Ambulation- I ? ?Managing Meds- I ? ?Follow up appointments reviewed: ? ?PCP Hospital f/u appt confirmed? No   ?Specialist Hospital f/u appt confirmed? Yes  Scheduled to see the surgeon on 11/02/21. ?Are transportation arrangements needed? No  ?If their condition worsens, is the pt aware to call PCP or go to the Emergency Dept.? Yes ?Was the patient provided with contact information for the PCP's office or ED? Yes ?Was to pt encouraged to call back with questions or concerns? Yes  ?

## 2021-10-30 ENCOUNTER — Ambulatory Visit: Payer: Managed Care, Other (non HMO) | Admitting: Podiatry

## 2021-11-05 ENCOUNTER — Other Ambulatory Visit: Payer: Self-pay | Admitting: Physician Assistant

## 2021-11-05 DIAGNOSIS — I10 Essential (primary) hypertension: Secondary | ICD-10-CM

## 2021-12-03 ENCOUNTER — Ambulatory Visit: Payer: Managed Care, Other (non HMO) | Admitting: Family Medicine

## 2021-12-23 ENCOUNTER — Other Ambulatory Visit: Payer: Self-pay | Admitting: Physician Assistant

## 2021-12-23 DIAGNOSIS — I10 Essential (primary) hypertension: Secondary | ICD-10-CM

## 2021-12-23 NOTE — Telephone Encounter (Signed)
V.mail left for patient to return call and schedule appt.

## 2021-12-23 NOTE — Telephone Encounter (Signed)
2 week appt sent for Metoprolol.  Needs follow up BP visit with Vanden Fawaz. Please call. Thanks.

## 2022-01-12 IMAGING — DX DG HIP (WITH OR WITHOUT PELVIS) 2-3V*L*
3 series · 3 of 3 positions shown · non-contrast
Comparison: None.

CLINICAL DATA: Pt has been having pain in left hip that radiates
down thigh to lower calf, also radiating to lower back on the left
side. Pt denies injury or any previous surgeries. The pain has
become worse in last two weeks with no relief from OTC meds, and she
is having a difficult time walking even short distances.

EXAM:
DG HIP (WITH OR WITHOUT PELVIS) 2-3V LEFT

[pelvis ap]
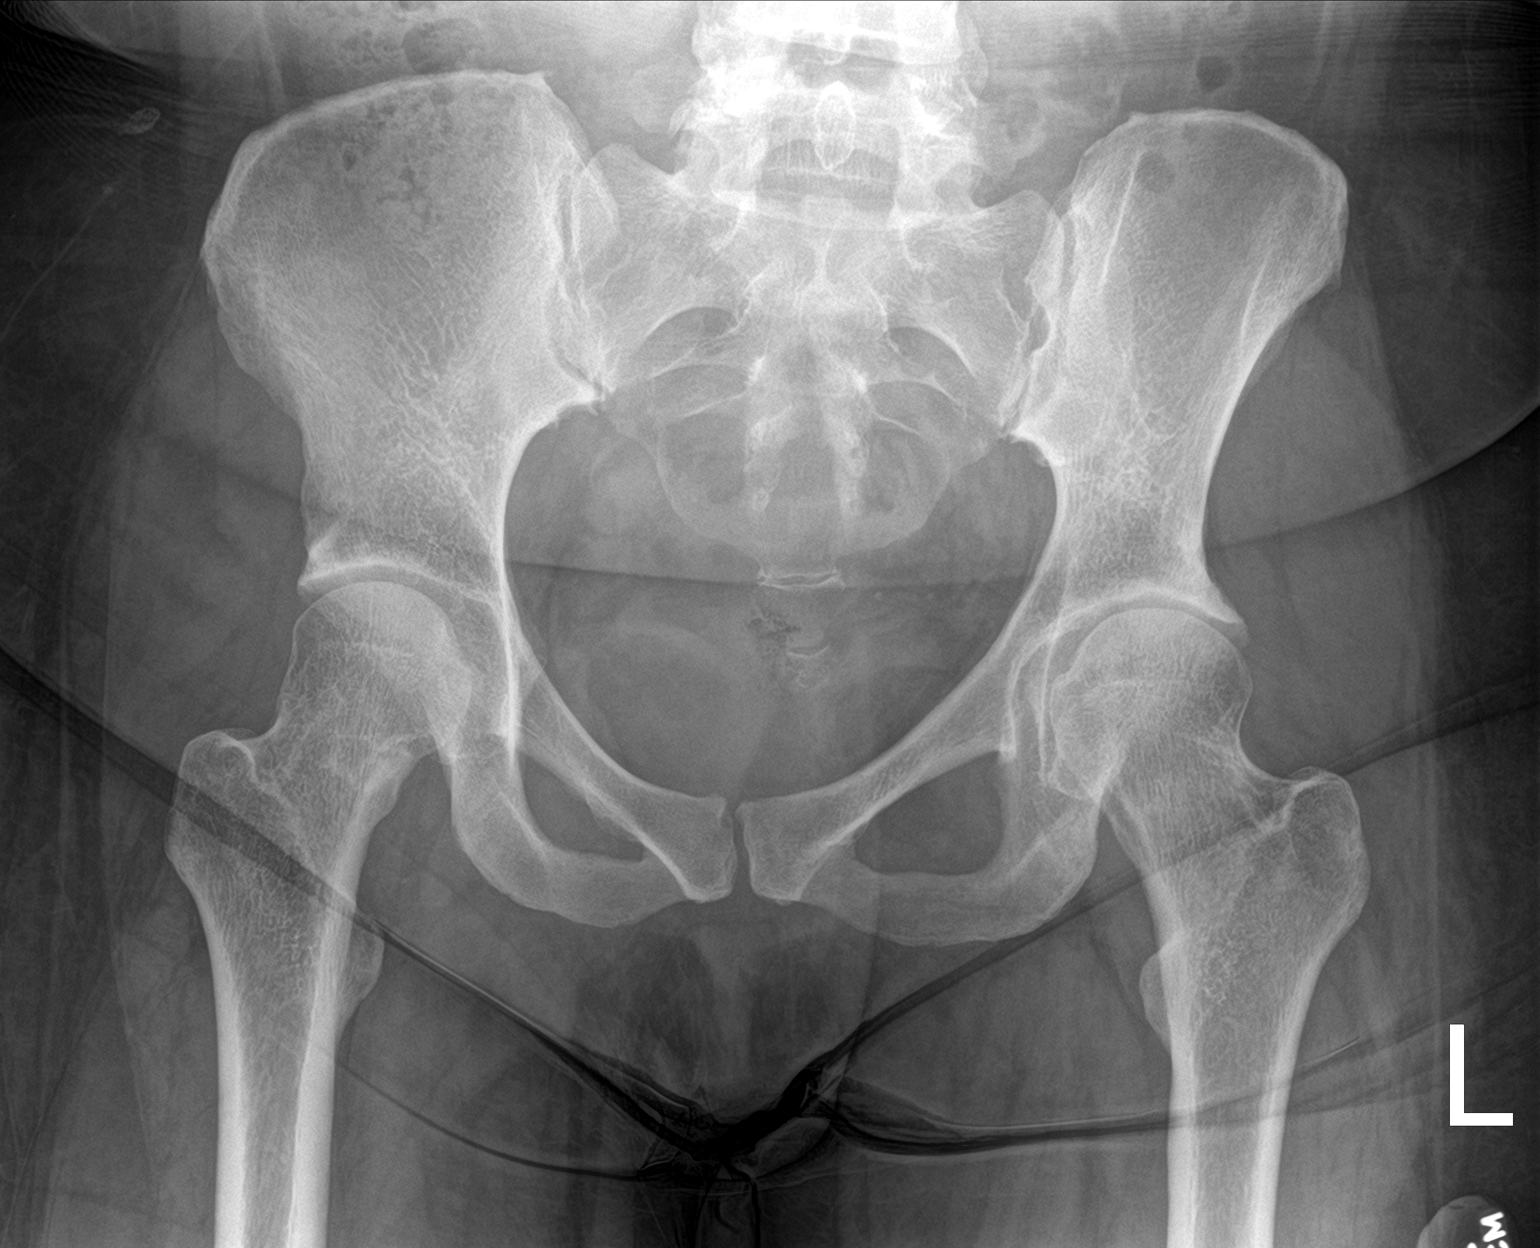

[hip ap]
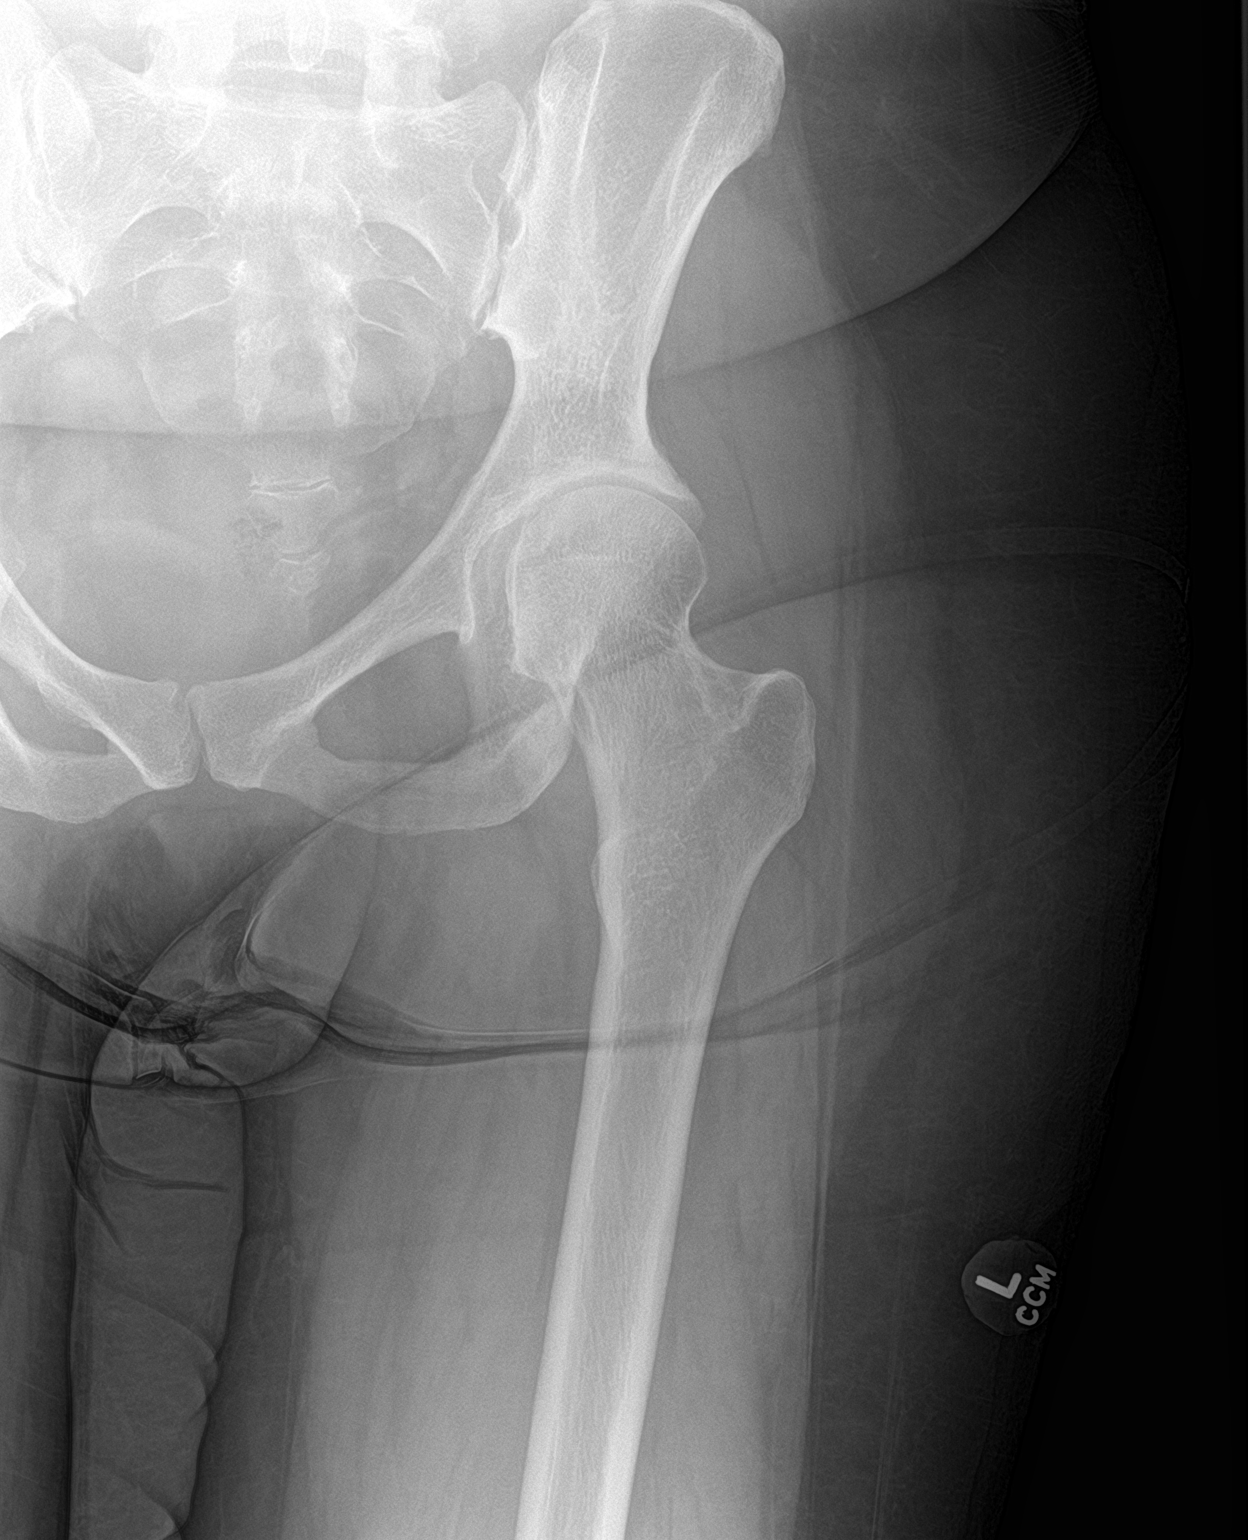

[hip lat]
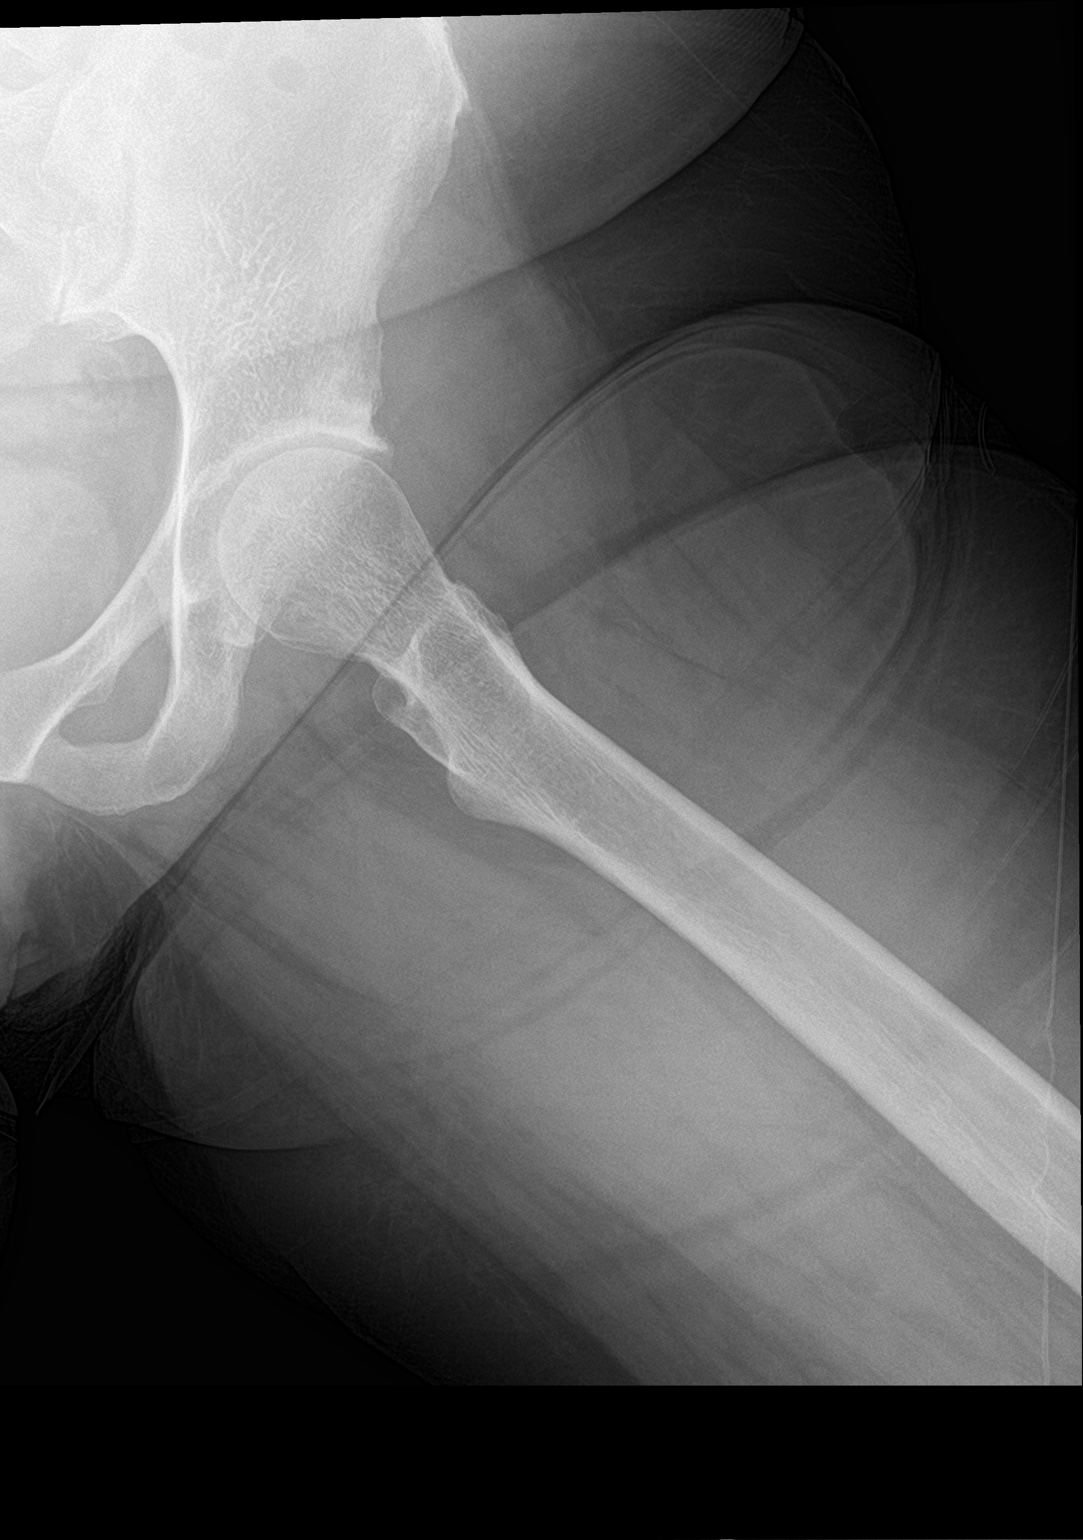

[3 of 3 positions shown; findings below may reference images not displayed]

FINDINGS: No fracture or bone lesion.

There is narrowing of the left hip joint space, most evident
superior laterally. Right hip joint, SI joints and symphysis pubis
are normally spaced and aligned. Bilateral coxa valga.

Soft tissues are unremarkable.
IMPRESSION: 1. No fracture, bone lesion or acute finding.
2. Degenerative changes of the left hip reflected by narrowing of
the joint space, mostly superior laterally. No other joint
abnormality. Bilateral coxa valga.

## 2022-01-12 IMAGING — DX DG LUMBAR SPINE COMPLETE 4+V
5 series · 5 of 5 positions shown · non-contrast
Comparison: 01/25/2018

CLINICAL DATA: Pt has been having pain in left hip that radiates
down thigh to lower calf, also radiating to lower back on the left
side. Pt denies injury or any previous surgeries. The pain has
become worse in last two weeks with no relief from OTC meds, and she
is having a difficult time walking even short distances.

EXAM:
LUMBAR SPINE - COMPLETE 4+ VIEW

[l-spine ap]
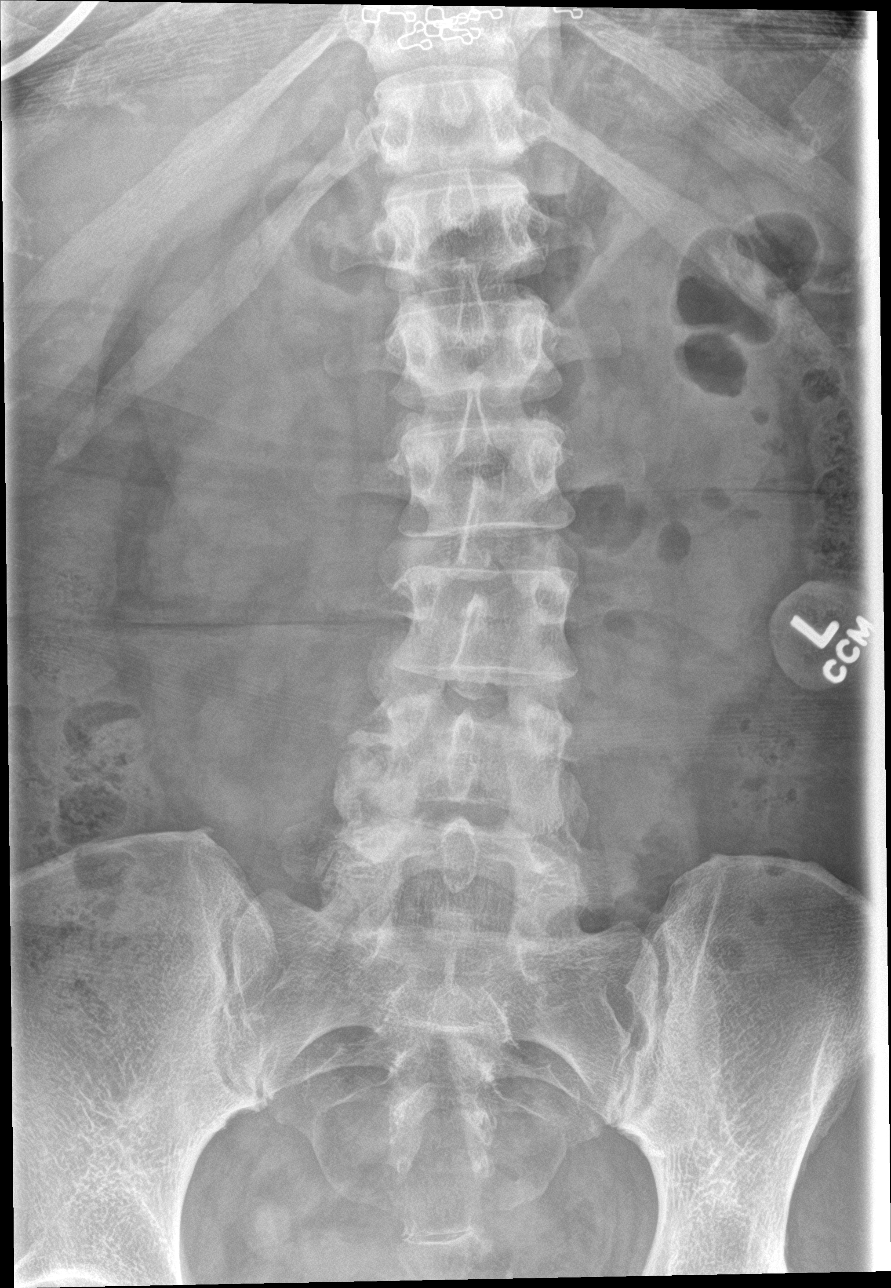

[l-spine obl (1 of 2)]
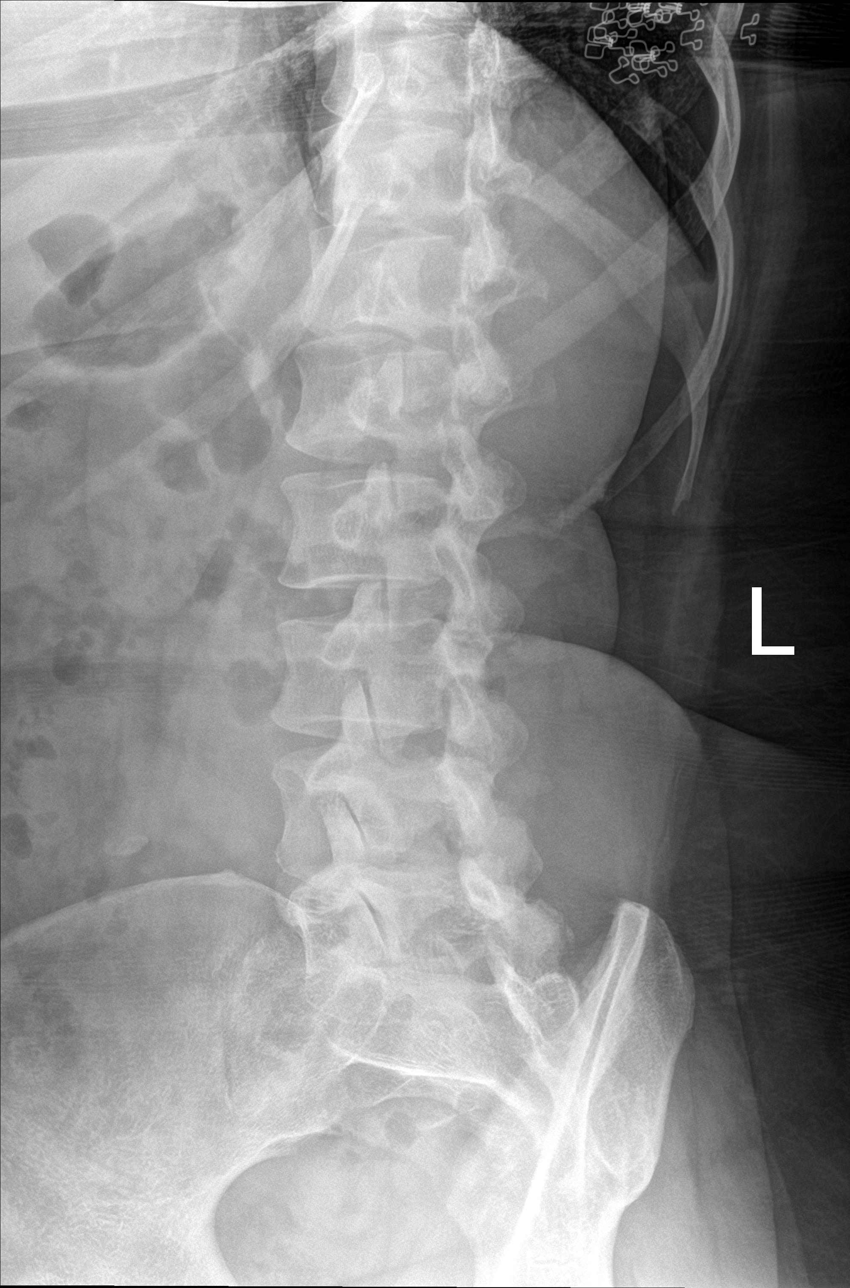

[l-spine obl (2 of 2)]
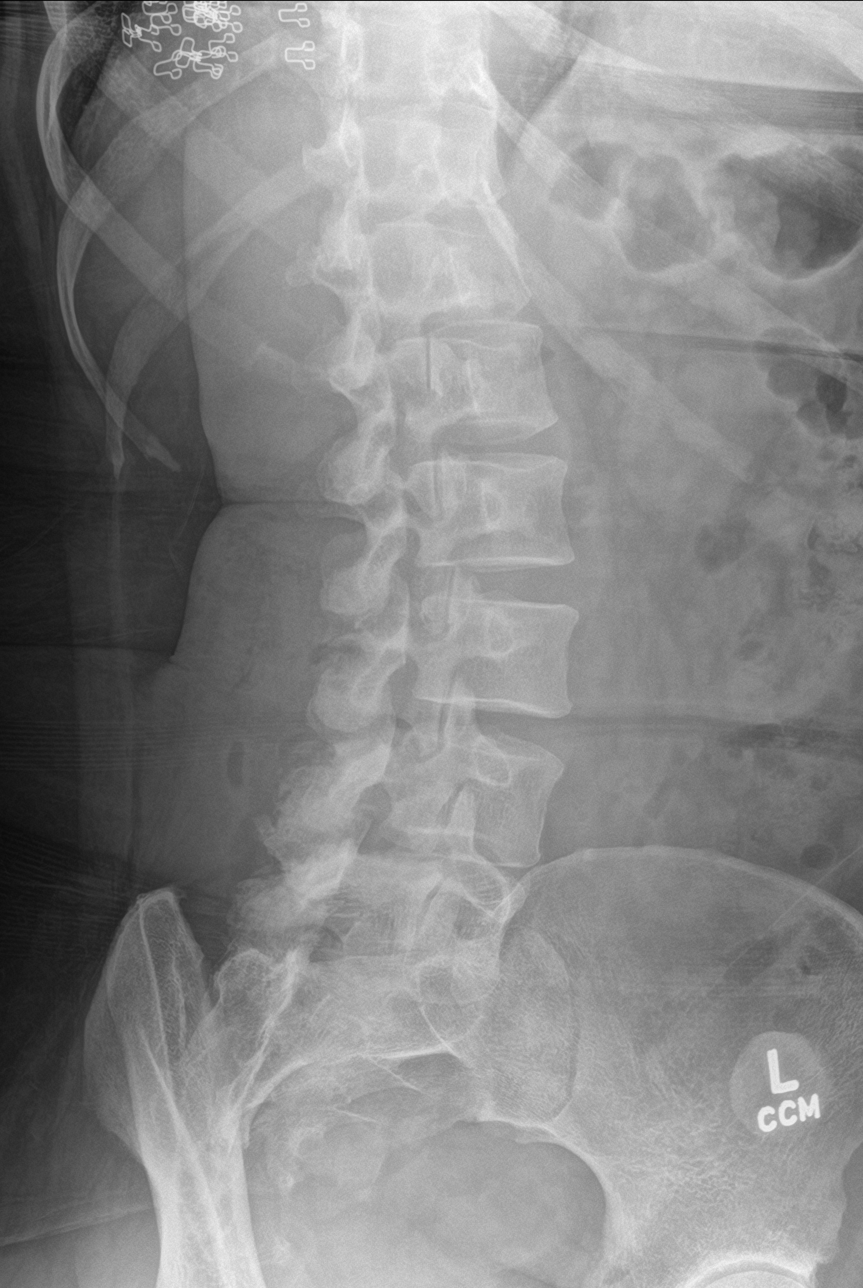

[l-spine lat]
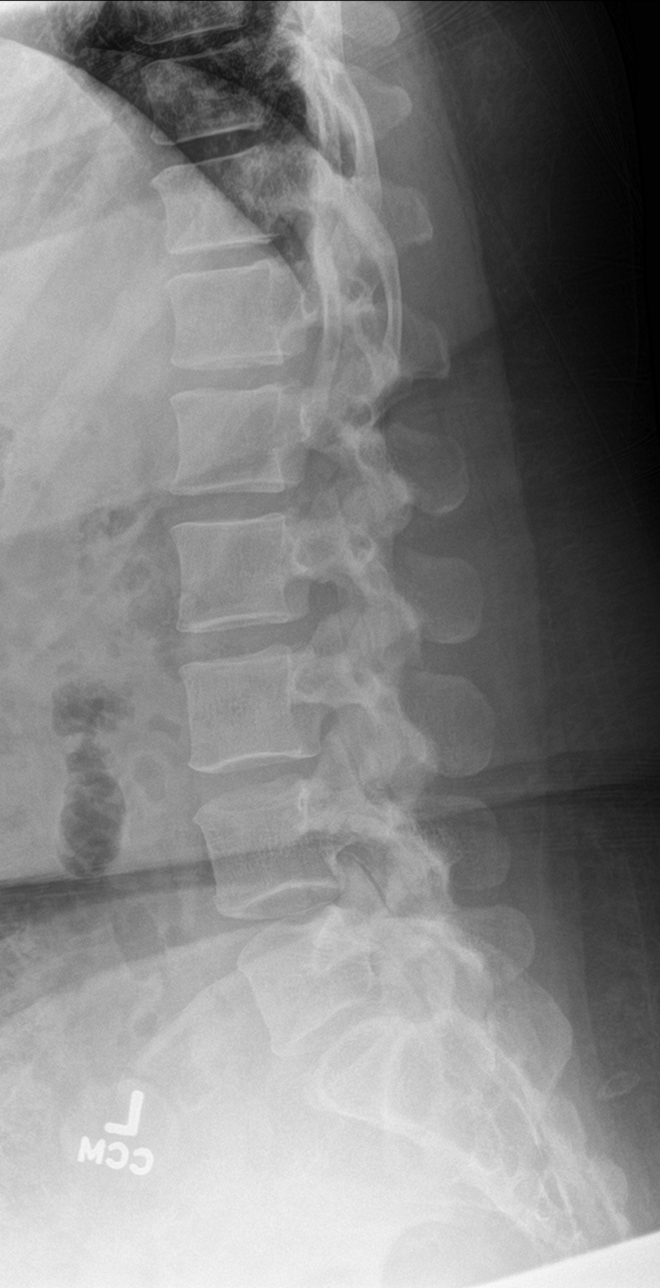

[l-spine spot]
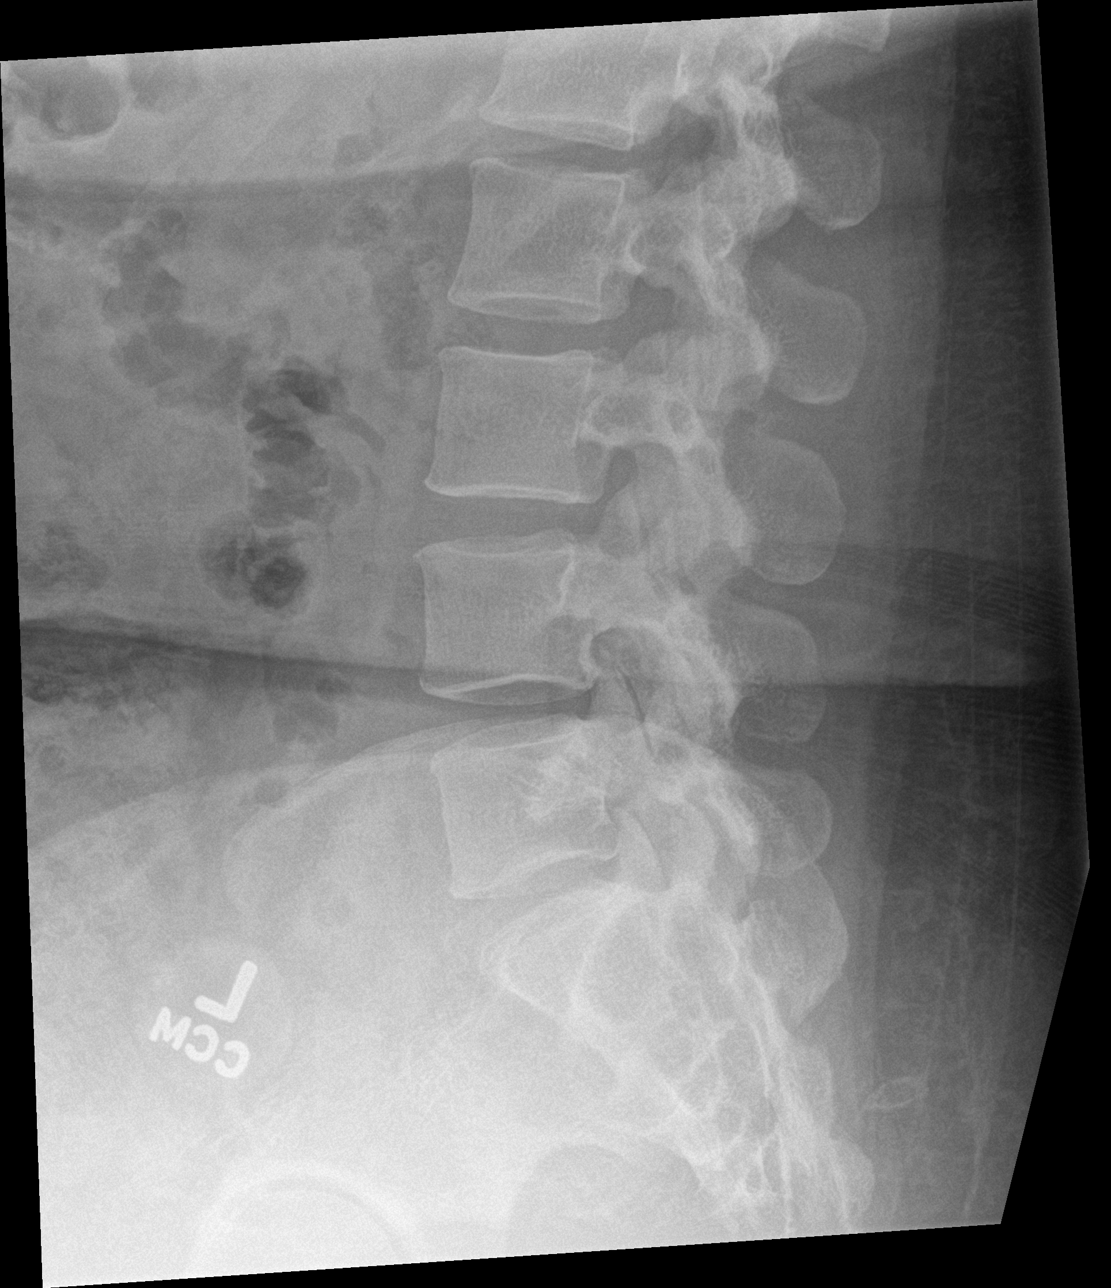

[5 of 5 positions shown; findings below may reference images not displayed]

FINDINGS: Transitional lumbosacral vertebra, designated L5. Mild
levoscoliosis. No fracture, bone lesion or spondylolisthesis. Discs
are well maintained in height. Mild facet joint narrowing and
sclerosis on the right at L4-L5 and L5-S1.

Soft tissues are unremarkable.
IMPRESSION: 1. No fracture or acute finding.
2. Minor degenerative changes and levoscoliosis as detailed. Stable
appearance from the prior study.

## 2022-01-13 ENCOUNTER — Telehealth: Payer: Self-pay | Admitting: Physician Assistant

## 2022-01-13 ENCOUNTER — Other Ambulatory Visit: Payer: Self-pay | Admitting: Physician Assistant

## 2022-01-13 DIAGNOSIS — I10 Essential (primary) hypertension: Secondary | ICD-10-CM

## 2022-01-13 MED ORDER — METOPROLOL SUCCINATE ER 100 MG PO TB24: ORAL_TABLET | ORAL | 0 refills | Status: DC

## 2022-01-13 NOTE — Telephone Encounter (Signed)
Per patient she is out of her BP medication. She is scheduled for an appointment on 8/16 and wanted to know if a refill could be sent to last until then.

## 2022-01-13 NOTE — Telephone Encounter (Signed)
Refill of Metoprolol sent to pharmacy to last til appt. MUST keep appt for future refills (was to follow up in March). Thanks.

## 2022-01-13 NOTE — Telephone Encounter (Signed)
Called patient to make aware. No answer but vm was left at 3:07 that she must keep appt for additional refills.

## 2022-02-01 ENCOUNTER — Telehealth: Payer: Self-pay | Admitting: General Practice

## 2022-02-01 ENCOUNTER — Ambulatory Visit: Payer: Managed Care, Other (non HMO) | Admitting: Family Medicine

## 2022-02-01 VITALS — Ht 71.0 in | Wt 250.0 lb

## 2022-02-01 DIAGNOSIS — R4189 Other symptoms and signs involving cognitive functions and awareness: Secondary | ICD-10-CM | POA: Diagnosis not present

## 2022-02-01 DIAGNOSIS — H60501 Unspecified acute noninfective otitis externa, right ear: Secondary | ICD-10-CM | POA: Diagnosis not present

## 2022-02-01 DIAGNOSIS — E876 Hypokalemia: Secondary | ICD-10-CM | POA: Diagnosis not present

## 2022-02-01 MED ORDER — CIPRO HC 0.2-1 % OT SUSP: OTIC | 0 refills | Status: DC

## 2022-02-01 NOTE — Telephone Encounter (Signed)
Transition Care Management Unsuccessful Follow-up Telephone Call  Date of discharge and from where:  01/29/22 from [facility]  Attempts:  1st Attempt  Reason for unsuccessful TCM follow-up call:  Left voice message

## 2022-02-01 NOTE — Progress Notes (Signed)
Acute Office Visit  Subjective:     Patient ID: Jean Yu, female    DOB: 25-Mar-1981, 41 y.o.   MRN: [id number]  Chief Complaint  Patient presents with   Hypertension    HPI Pt presents to clinic for ED follow up. She was seen in the ER a few days ago and was diagnosed with HTN and presyncope. Prior to presenting to the ED her BP was elevated and she felt weak and lightheaded and felt like she was going to pass out. In the ED labs were significant for hypokalemia which pt was given K supplement. CXR and CT chest were negative. Troponins were negative. Her BP returned to normal without treatment in the ED.   Today, pt's BP is 115/79. She is continuing to have symptoms of blurred vision and headaches. She called into work today. She is having spells of confusion, wooziness, and sweating. She took a covid test that was negative. No sick contacts. She is taking elderberry, tumeric, Vit D, Vit C and Fenugreek. She did recently add Magnesium at night.   She has a history of MS and says she gets hand and leg pain and it usually happens. Her last flare up was last November.   Review of Systems  Constitutional:  Negative for chills and fever.  Eyes:  Positive for blurred vision.  Respiratory:  Negative for cough and shortness of breath.   Cardiovascular:  Negative for chest pain.  Neurological:  Negative for headaches.        Objective:    BP 115/79   Pulse 67   Ht 5\' 11"  (1.803 m)   Wt 250 lb (113.4 kg)   SpO2 100%   BMI 34.87 kg/m    Physical Exam Vitals and nursing note reviewed.  Constitutional:      General: She is not in acute distress.    Appearance: Normal appearance.  HENT:     Head: Normocephalic and atraumatic.     Right Ear: External ear normal.     Left Ear: Tympanic membrane, ear canal and external ear normal.     Ears:     Comments: R ear canal erythema    Nose: Nose normal.  Eyes:     Conjunctiva/sclera: Conjunctivae normal.   Cardiovascular:     Rate and Rhythm: Normal rate and regular rhythm.  Pulmonary:     Effort: Pulmonary effort is normal.     Breath sounds: Normal breath sounds.  Neurological:     General: No focal deficit present.     Mental Status: She is alert and oriented to person, place, and time.  Psychiatric:        Mood and Affect: Mood normal.        Behavior: Behavior normal.        Thought Content: Thought content normal.        Judgment: Judgment normal.     No results found for any visits on 02/01/22.      Assessment & Plan:   Problem List Items Addressed This Visit       Nervous and Auditory   Acute otitis externa of right ear    - erythema of ear canal seen on exam. This could possibly be a cause of her symptoms if they are affecting her inner ear - will go ahead and treat with cipro drops to see if we can get symptoms relief.       Relevant Medications   ciprofloxacin-hydrocortisone (CIPRO HC) OTIC suspension  Other   Hypokalemia - Primary    - pt hypokalemic in the ED and given replacement therapy. Have reordered labs to ensure K is back at baseline        Relevant Orders   COMPLETE METABOLIC PANEL WITH GFR   Brain fog    - ordered labs to check for elyte abnormalities  - also ordered ammonia level since pt admitted to brain fog and "feeling drunk" - also discussed this could be the start of a possible MS flare. Pt said she usually gets arm and leg cramps and says this feels different.  - encouraged pt to call neurology since she is due for updated brain scans. Also wondering if this is part of a flare and requires steroids. Will wait and see what blood work shows.  - orthostatics done in clinic and negative.      Relevant Orders   CBC   TSH + free T4   Ammonia   Magnesium    Meds ordered this encounter  Medications   ciprofloxacin-hydrocortisone (CIPRO HC) OTIC suspension    Sig: Place 3 drops in right ear two times daily for seven days.     Dispense:  10 mL    Refill:  0    Return in about 2 weeks (around 02/15/2022).  Charlton Amor, DO

## 2022-02-01 NOTE — Assessment & Plan Note (Signed)
-   erythema of ear canal seen on exam. This could possibly be a cause of her symptoms if they are affecting her inner ear - will go ahead and treat with cipro drops to see if we can get symptoms relief.

## 2022-02-01 NOTE — Assessment & Plan Note (Addendum)
-   ordered labs to check for elyte abnormalities  - also ordered ammonia level since pt admitted to brain fog and "feeling drunk" - also discussed this could be the start of a possible MS flare. Pt said she usually gets arm and leg cramps and says this feels different.  - encouraged pt to call neurology since she is due for updated brain scans. Also wondering if this is part of a flare and requires steroids. Will wait and see what blood work shows.  - orthostatics done in clinic and negative.

## 2022-02-01 NOTE — Assessment & Plan Note (Signed)
-   pt hypokalemic in the ED and given replacement therapy. Have reordered labs to ensure K is back at baseline

## 2022-02-02 ENCOUNTER — Encounter: Payer: Self-pay | Admitting: Family Medicine

## 2022-02-02 LAB — CBC
HCT: 38.3 % (ref 35.0–45.0)
Hemoglobin: 12.7 g/dL (ref 11.7–15.5)
MCH: 28 pg (ref 27.0–33.0)
MCHC: 33.2 g/dL (ref 32.0–36.0)
MCV: 84.5 fL (ref 80.0–100.0)
MPV: 12.2 fL (ref 7.5–12.5)
Platelets: 197 10*3/uL (ref 140–400)
RBC: 4.53 10*6/uL (ref 3.80–5.10)
RDW: 12.5 % (ref 11.0–15.0)
WBC: 3.8 10*3/uL (ref 3.8–10.8)

## 2022-02-02 LAB — TSH+FREE T4: TSH W/REFLEX TO FT4: 0.98 mIU/L

## 2022-02-02 LAB — COMPLETE METABOLIC PANEL WITH GFR
AG Ratio: 1.4 (calc) (ref 1.0–2.5)
ALT: 11 U/L (ref 6–29)
AST: 13 U/L (ref 10–30)
Albumin: 4.3 g/dL (ref 3.6–5.1)
Alkaline phosphatase (APISO): 53 U/L (ref 31–125)
BUN: 13 mg/dL (ref 7–25)
CO2: 27 mmol/L (ref 20–32)
Calcium: 9.3 mg/dL (ref 8.6–10.2)
Chloride: 102 mmol/L (ref 98–110)
Creat: 0.84 mg/dL (ref 0.50–0.99)
Globulin: 3 g/dL (calc) (ref 1.9–3.7)
Glucose, Bld: 78 mg/dL (ref 65–99)
Potassium: 4 mmol/L (ref 3.5–5.3)
Sodium: 136 mmol/L (ref 135–146)
Total Bilirubin: 0.4 mg/dL (ref 0.2–1.2)
Total Protein: 7.3 g/dL (ref 6.1–8.1)
eGFR: 89 mL/min/{1.73_m2} (ref >=60)

## 2022-02-02 LAB — AMMONIA: Ammonia: 30 umol/L (ref <=72)

## 2022-02-02 LAB — MAGNESIUM: Magnesium: 1.9 mg/dL (ref 1.5–2.5)

## 2022-02-02 NOTE — Telephone Encounter (Signed)
Transition Care Management Unsuccessful Follow-up Telephone Call  Date of discharge and from where:  01/29/22 from [facility]  Attempts:  2nd Attempt  Reason for unsuccessful TCM follow-up call:  Left voice message    

## 2022-02-03 ENCOUNTER — Other Ambulatory Visit: Payer: Self-pay | Admitting: Family Medicine

## 2022-02-03 MED ORDER — OFLOXACIN 0.3 % OT SOLN: 10.0000 [drp] | Freq: Every day | OTIC | 0 refills | Status: DC

## 2022-02-08 ENCOUNTER — Other Ambulatory Visit: Payer: Self-pay | Admitting: Physician Assistant

## 2022-02-08 DIAGNOSIS — I10 Essential (primary) hypertension: Secondary | ICD-10-CM

## 2022-02-08 NOTE — Telephone Encounter (Signed)
Transition Care Management Follow-up Telephone Call Date of discharge and from where: 01/29/22 from [facility] How have you been since you were released from the hospital? Patient had OV with Dr. Tamera Punt on 02/01/22 Any questions or concerns? No

## 2022-02-10 ENCOUNTER — Ambulatory Visit: Payer: Managed Care, Other (non HMO) | Admitting: Physician Assistant

## 2022-02-17 ENCOUNTER — Ambulatory Visit: Payer: Managed Care, Other (non HMO) | Admitting: Physician Assistant

## 2022-02-17 ENCOUNTER — Encounter: Payer: Self-pay | Admitting: Physician Assistant

## 2022-02-17 VITALS — BP 142/104 | HR 69 | Ht 71.0 in | Wt 253.0 lb

## 2022-02-17 DIAGNOSIS — R0989 Other specified symptoms and signs involving the circulatory and respiratory systems: Secondary | ICD-10-CM | POA: Diagnosis not present

## 2022-02-17 DIAGNOSIS — S76311S Strain of muscle, fascia and tendon of the posterior muscle group at thigh level, right thigh, sequela: Secondary | ICD-10-CM | POA: Diagnosis not present

## 2022-02-17 DIAGNOSIS — Z9989 Dependence on other enabling machines and devices: Secondary | ICD-10-CM

## 2022-02-17 DIAGNOSIS — Z23 Encounter for immunization: Secondary | ICD-10-CM

## 2022-02-17 DIAGNOSIS — G4733 Obstructive sleep apnea (adult) (pediatric): Secondary | ICD-10-CM | POA: Diagnosis not present

## 2022-02-17 MED ORDER — CLONIDINE HCL 0.1 MG PO TABS: ORAL_TABLET | ORAL | 0 refills | Status: AC

## 2022-02-17 MED ORDER — DICLOFENAC SODIUM 1 % EX GEL: 4.0000 g | Freq: Four times a day (QID) | CUTANEOUS | 2 refills | Status: DC

## 2022-02-17 NOTE — Patient Instructions (Addendum)
Get labs Will make cardiology referral Clonadine as needed when BP is over 160/90

## 2022-02-17 NOTE — Progress Notes (Signed)
Established Patient Office Visit  Subjective   Patient ID: Jean Yu, female    DOB: 11-22-1980  Age: 40 y.o. MRN: [id number]  Chief Complaint  Patient presents with   Follow-up    HPI Pt is a 41 yo obese female with HTN, PVC, MS, OSA who presents to the clinic to follow up on HTN.   Pt is going to have to have surgery on right hamstring in 20-May-2023 that is ruptured. She request her voltaren gel for refills.   Pt has been having BP elevation with dizziness, brain fog, hot flashes and episodes of weakness since early august. She went to ED on 01/28/2022. Cardiac work up was negative except for low potassium. Her BP regulated without needing medication. She is checking BP at home and when she starts to feel bad it will increase and at its highest was 189 over 125. Denies and CP or palpitations. She does feel very weak when this occurs and has to lay down. She has been using cPAP for 4 years nightly. No other changes have occurred.    .. Active Ambulatory Problems    Diagnosis Date Noted   Primary hypertension 12/18/2008   MS (multiple sclerosis) (HCC) 12/09/2005   BMI 33.0-33.9,adult 03/29/2017   Obstructive sleep apnea 05/19/17   Family history of sudden cardiac death in mother 05/19/17   Chest pain with low risk of acute coronary syndrome 05/19/17   Large breasts 08/29/2017   Class 1 obesity due to excess calories without serious comorbidity with body mass index (BMI) of 34.0 to 34.9 in adult 08/29/2017   Left arm pain 05/11/2019   Palpitations 05/11/2019   Breast skin changes 05/11/2019   Breast tenderness in female 05/11/2019   Osteoarthritis of left hip 01/09/2020   Acute reaction to stress 02/20/2020   Carpal tunnel syndrome 09/18/2015   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   Circadian rhythm sleep disorder, 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   Degenerative disc disease, lumbar 08/23/2019   Fatigue 09/18/2015   High risk  medication use 11/16/2017   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   Left leg pain 02/17/2018   Memory loss 09/18/2015   Numbness and tingling in both hands 09/18/2015   Oligomenorrhea 04/29/2014   Premature ventricular contractions (PVCs) (VPCs) 02/20/2020   Unsteady gait 09/18/2015   Vitamin D insufficiency 01/01/2013   Acute intractable headache 02/27/2020   Primary osteoarthritis of left hip 03/28/2020   Hypotension due to drugs 07/10/2020   Anemia 07/15/2020   Hypokalemia 07/15/2020   Right-sided chest pain 07/15/2020   Burping 07/15/2020   Indigestion 07/15/2020   Left hip pain 09/16/2020   Hemorrhoids 11/20/2020   Choking 11/20/2020   Sinus drainage 11/20/2020   Bunion of great toe of left foot 07/21/2021   Bunion of great toe of right foot 07/21/2021   Left-sided chest pain 07/21/2021   Gastroesophageal reflux disease without esophagitis 07/21/2021   Brain fog 02/01/2022   Acute otitis externa of right ear 02/01/2022   Labile hypertension 02/17/2022   Right proximal hamstring tendon rupture, sequela 02/19/2022   OSA on CPAP 02/19/2022   Resolved Ambulatory Problems    Diagnosis Date Noted   PHLEBITIS&THROMBOPHLEB SUP VESSELS LOWER EXTREM 12/18/2008   CRAMP IN LIMB 12/18/2008   EDEMA LEG 12/18/2008   Bright red blood per rectum 11/20/2020   Past Medical History:  Diagnosis Date   Borderline high cholesterol    Hypertension    Obesity  OSA (obstructive sleep apnea)      ROS See HPI.    Objective:     BP (!) 142/104   Pulse 69   Ht 5\' 11"  (1.803 m)   Wt 253 lb (114.8 kg)   SpO2 99%   BMI 35.29 kg/m  BP Readings from Last 3 Encounters:  02/17/22 (!) 142/104  07/21/21 123/90  07/16/21 127/85   Wt Readings from Last 3 Encounters:  02/17/22 253 lb (114.8 kg)  02/01/22 250 lb (113.4 kg)  07/21/21 244 lb (110.7 kg)      Physical Exam Constitutional:      Appearance: Normal appearance. She is obese.  HENT:     Head:  Normocephalic.  Neck:     Vascular: No carotid bruit.  Cardiovascular:     Rate and Rhythm: Normal rate and regular rhythm.     Pulses: Normal pulses.     Heart sounds: Normal heart sounds.  Pulmonary:     Effort: Pulmonary effort is normal.     Breath sounds: Normal breath sounds.  Musculoskeletal:     Cervical back: Normal range of motion and neck supple.     Right lower leg: No edema.     Left lower leg: No edema.  Lymphadenopathy:     Cervical: No cervical adenopathy.  Neurological:     General: No focal deficit present.     Mental Status: She is alert and oriented to person, place, and time.  Psychiatric:        Mood and Affect: Mood normal.          Assessment & Plan:  01/26/23Marland KitchenIbtisam was seen today for follow-up.  Diagnoses and all orders for this visit:  Labile hypertension -     Catecholamines, Fractionated, Plasma -     Metanephrines, urine, 24 hour -     Catecholamines, fractionated, urine, 24 hour; Future -     Creatinine, urine, 24 hour; Future -     Aldosterone + renin activity w/ ratio -     cloNIDine (CATAPRES) 0.1 MG tablet; Take one tablet as needed up to twice a day when BP is over 160/90. -     Ambulatory referral to Cardiology  Need for influenza vaccination -     Flu Vaccine QUAD 54mo+IM (Fluarix, Fluzone & Alfiuria Quad PF)  Right proximal hamstring tendon rupture, sequela -     diclofenac Sodium (VOLTAREN) 1 % GEL; Apply 4 g topically 4 (four) times daily. To affected joint.  OSA on CPAP   Pt needs to be fully evaluated before surgery in November.  Concerned with waves of hypertension and then very normal readings I do not want to start her on a daily BP med increase for fear of hypotension ?pheochromocytoma Labs ordered Referral made to cardiology Clonadine given to use as needed for very high readings  Flu shot given today  Refilled voltaren gel for hamstring   December, PA-C

## 2022-02-19 DIAGNOSIS — S76311S Strain of muscle, fascia and tendon of the posterior muscle group at thigh level, right thigh, sequela: Secondary | ICD-10-CM | POA: Insufficient documentation

## 2022-02-19 DIAGNOSIS — G4733 Obstructive sleep apnea (adult) (pediatric): Secondary | ICD-10-CM | POA: Insufficient documentation

## 2022-02-23 ENCOUNTER — Other Ambulatory Visit: Payer: Self-pay | Admitting: Physician Assistant

## 2022-02-23 DIAGNOSIS — Z1231 Encounter for screening mammogram for malignant neoplasm of breast: Secondary | ICD-10-CM

## 2022-02-25 DIAGNOSIS — Z1231 Encounter for screening mammogram for malignant neoplasm of breast: Secondary | ICD-10-CM

## 2022-02-26 NOTE — Progress Notes (Signed)
Pending some results. Aldosterone renin active is normal.

## 2022-03-04 ENCOUNTER — Other Ambulatory Visit: Payer: Self-pay | Admitting: Physician Assistant

## 2022-03-04 DIAGNOSIS — I10 Essential (primary) hypertension: Secondary | ICD-10-CM

## 2022-03-05 LAB — ALDOSTERONE + RENIN ACTIVITY W/ RATIO
ALDO / PRA Ratio: 19.1 Ratio (ref 0.9–28.9)
Aldosterone: 9 ng/dL
Renin Activity: 0.47 ng/mL/h (ref 0.25–5.82)

## 2022-03-05 LAB — CATECHOLAMINES, FRACTIONATED, PLASMA
Dopamine: <10 pg/mL
Epinephrine: <20 pg/mL
Norepinephrine: 746 pg/mL
Total Catecholamines: 746 pg/mL

## 2022-03-05 NOTE — Progress Notes (Signed)
So far all results normal.

## 2022-03-11 ENCOUNTER — Ambulatory Visit (INDEPENDENT_AMBULATORY_CARE_PROVIDER_SITE_OTHER): Payer: Managed Care, Other (non HMO)

## 2022-03-11 DIAGNOSIS — Z1231 Encounter for screening mammogram for malignant neoplasm of breast: Secondary | ICD-10-CM | POA: Diagnosis not present

## 2022-03-15 ENCOUNTER — Ambulatory Visit: Payer: Managed Care, Other (non HMO) | Admitting: Physician Assistant

## 2022-03-15 NOTE — Progress Notes (Signed)
Normal mammogram. Follow up in one year.

## 2022-03-19 ENCOUNTER — Encounter: Payer: Self-pay | Admitting: Physician Assistant

## 2022-03-24 ENCOUNTER — Encounter: Payer: Self-pay | Admitting: Physician Assistant

## 2022-03-24 ENCOUNTER — Ambulatory Visit: Payer: Managed Care, Other (non HMO) | Admitting: Physician Assistant

## 2022-03-24 VITALS — BP 138/98 | HR 58 | Ht 71.0 in | Wt 247.0 lb

## 2022-03-24 DIAGNOSIS — R2242 Localized swelling, mass and lump, left lower limb: Secondary | ICD-10-CM | POA: Diagnosis not present

## 2022-03-24 MED ORDER — FUROSEMIDE 20 MG PO TABS: ORAL_TABLET | ORAL | 1 refills | Status: AC

## 2022-03-24 NOTE — Progress Notes (Signed)
   Established Patient Office Visit  Subjective   Patient ID: Mea Ozga, female    DOB: 1980-12-14  Age: 41 y.o. MRN: [id number]  Chief Complaint  Patient presents with   Edema    HPI Pt is a 40 yo F with PMH of HTN, OSA, DDD, MS, obesity that presents with bilateral leg edema worse on the left that comes and goes. She says her legs are painful and feels like they are tight and full and uncomfortable. She wears compression stockings and elevates her feet at night, which help. This has happened several times over the years associated with negative [place] for DVT. She has had bilateral hip replacement in 08/2021 and has a right hamstring tear waiting surgical repair.  ROS Denies CP, SOB, numbness, weakness, abdominal pain   Objective:     BP (!) 138/98   Pulse (!) 58   Ht 5\' 11"  (1.803 m)   Wt 247 lb (112 kg)   LMP 03/04/2022 (Exact Date)   SpO2 99%   BMI 34.45 kg/m  BP Readings from Last 3 Encounters:  03/24/22 (!) 138/98  02/17/22 (!) 142/104  07/21/21 123/90   Wt Readings from Last 3 Encounters:  03/24/22 247 lb (112 kg)  02/17/22 253 lb (114.8 kg)  02/01/22 250 lb (113.4 kg)   SpO2 Readings from Last 3 Encounters:  03/24/22 99%  02/17/22 99%  02/01/22 100%      Physical Exam Constitutional:      Appearance: Normal appearance. She is obese.  Cardiovascular:     Rate and Rhythm: Normal rate and regular rhythm.  Pulmonary:     Effort: Pulmonary effort is normal.  Musculoskeletal:     Right lower leg: Edema present.     Left lower leg: Edema present.     Comments: Nonpitting edema of bilateral lower extremities L calf measuring 46 cm, L thigh measuring 63 cm R calf measuring 44 cm, R thigh measuring 63 cm 1+ distal pedal pulse left foot  Skin:    General: Skin is warm and dry.     Capillary Refill: Capillary refill takes less than 2 seconds.  Neurological:     Mental Status: She is alert and oriented to person, place, and time.  Psychiatric:         Mood and Affect: Mood normal.        Assessment & Plan:    Marland KitchenMarland KitchenDarriel was seen today for edema.  Diagnoses and all orders for this visit:  Localized swelling of left lower leg -     furosemide (LASIX) 20 MG tablet; Take one tablet as needed for left leg swelling   Likely swelling from chronic venous changes or even surgical changes or even due to putting more weight on left side while waiting for right hamstring repair Only size change was in the left lower calf Continue with compression Added prn lasix Discussed increased urination and possible BP decrease Follow up as needed or if symptoms persist or worsen   Iran Planas, PA-C

## 2022-03-24 NOTE — Patient Instructions (Signed)
Lymphedema ? ?Lymphedema is swelling that is caused by the abnormal collection of lymph in the tissues under the skin. Lymph is excess fluid from the tissues in your body that is removed through the lymphatic system. This system is part of your body's defense system (immune system) and includes lymph nodes and lymph vessels. The lymph vessels collect and carry the excess fluid, fats, proteins, and waste from the tissues of the body to the bloodstream. This system also works to clean and remove bacteria and waste products from the body. ?Lymphedema occurs when the lymphatic system is blocked. When the lymph vessels or lymph nodes are blocked or damaged, lymph does not drain properly. This causes an abnormal buildup of lymph, which leads to swelling in the affected area. This may include the trunk area, or an arm or leg. Lymphedema cannot be cured by medicines, but various methods can be used to help reduce the swelling. ?What are the causes? ?The cause of this condition depends on the type of lymphedema that you have. ?Primary lymphedema is caused by the absence of lymph vessels or having abnormal lymph vessels at birth. ?Secondary lymphedema occurs when lymph vessels are blocked or damaged. Secondary lymphedema is more common. Common causes of lymph vessel blockage include: ?Skin infection, such as cellulitis. ?Infection by parasites (filariasis). ?Injury. ?Radiation therapy. ?Cancer. ?Formation of scar tissue. ?Surgery. ?What are the signs or symptoms? ?Symptoms of this condition include: ?Swelling of the arm or leg. ?A heavy or tight feeling in the arm or leg. ?Swelling of the feet, toes, or fingers. Shoes or rings may fit more tightly than before. ?Redness of the skin over the affected area. ?Limited movement of the affected limb. ?Sensitivity to touch or discomfort in the affected limb. ?How is this diagnosed? ?This condition may be diagnosed based on: ?Your symptoms and medical history. ?A physical  exam. ?Bioimpedance spectroscopy. In this test, painless electrical currents are used to measure fluid levels in your body. ?Imaging tests, such as: ?MRI. ?CT scan. ?Duplex ultrasound. This test uses sound waves to produce images of the vessels and the blood flow on a screen. ?Lymphoscintigraphy. In this test, a low dose of a radioactive substance is injected to trace the flow of lymph through your lymph vessels. ?Lymphangiography. In this test, a contrast dye is injected into the lymph vessel to help show blockages. ?How is this treated? ? ?If an underlying condition is causing the lymphedema, that condition will be treated. For example, antibiotic medicines may be used to treat an infection. ?Treatment for this condition will depend on the cause of your lymphedema. Treatment may include: ?Complete decongestive therapy (CDT). This is done by a certified lymphedema therapist to reduce fluid congestion. This therapy includes: ?Skin care. ?Compression wrapping of the affected area. ?Manual lymph drainage. This is a special massage technique that promotes lymph drainage out of a limb. ?Specific exercises. Certain exercises can help fluid move out of the affected limb. ?Compression. Various methods may be used to apply pressure to the affected limb to reduce the swelling. They include: ?Wearing compression stockings or sleeves on the affected limb. ?Wrapping the affected limb with special bandages. ?Surgery. This is usually done for severe cases only. For example, surgery may be done if you have trouble moving the limb or if the swelling does not get better with other treatments. ?Follow these instructions at home: ?Self-care ?The affected area is more likely to become injured or infected. Take these steps to help prevent infection: ?Keep the affected   area clean and dry. ?Use approved creams or lotions to keep the skin moisturized. ?Protect your skin from cuts: ?Use gloves while cooking or gardening. ?Do not walk  barefoot. ?If you shave the affected area, use an electric razor. ?Do not wear tight clothes, shoes, or jewelry. ?Eat a healthy diet that includes a lot of fruits and vegetables. ?Activity ?Do exercises as told by your health care provider. ?Do not sit with your legs crossed. ?When possible, keep the affected limb raised (elevated) above the level of your heart. ?Avoid carrying things with an arm that is affected by lymphedema. ?General instructions ?Wear compression stockings or sleeves as told by your health care provider. ?Note any changes in size of the affected limb. You may be instructed to take regular measurements and keep track of them. ?Take over-the-counter and prescription medicines only as told by your health care provider. ?If you were prescribed an antibiotic medicine, take or apply it as told by your health care provider. Do not stop using the antibiotic even if you start to feel better or if your condition improves. ?Do not use heating pads or ice packs on the affected area. ?Avoid having blood draws, IV insertions, or blood pressure checks on the affected limb. ?Keep all follow-up visits. This is important. ?Contact a health care provider if you: ?Continue to have swelling in your limb. ?Have fluid leaking from the skin of your swollen limb. ?Have a cut that does not heal. ?Have redness or pain in the affected area. ?Develop purplish spots, rash, blisters, or sores (lesions) on your affected limb. ?Get help right away if you: ?Have new swelling in your limb that starts suddenly. ?Have shortness of breath or chest pain. ?Have a fever or chills. ?These symptoms may represent a serious problem that is an emergency. Do not wait to see if the symptoms will go away. Get medical help right away. Call your local emergency services (911 in the [place] Do not drive yourself to the hospital. ?Summary ?Lymphedema is swelling that is caused by the abnormal collection of lymph in the tissues under the  skin. ?Lymph is fluid from the tissues in your body that is removed through the lymphatic system. This system collects and carries excess fluid, fats, proteins, and wastes from the tissues of the body to the bloodstream. ?Lymphedema causes swelling, pain, and redness in the affected area. This may include the trunk area, or an arm or leg. ?Treatment for this condition may depend on the cause of your lymphedema. Treatment may include treating the underlying cause, complete decongestive therapy (CDT), compression methods, or surgery. ?This information is not intended to replace advice given to you by your health care provider. Make sure you discuss any questions you have with your health care provider. ?Document Revised: 04/09/2020 Document Reviewed: 04/09/2020 ?Elsevier Patient Education ? 2023 [employer] ? ?

## 2022-03-24 NOTE — Progress Notes (Deleted)
March 27 this year

## 2022-05-30 ENCOUNTER — Other Ambulatory Visit: Payer: Self-pay | Admitting: Physician Assistant

## 2022-05-30 DIAGNOSIS — I10 Essential (primary) hypertension: Secondary | ICD-10-CM

## 2022-06-09 ENCOUNTER — Other Ambulatory Visit: Payer: Self-pay | Admitting: Physician Assistant

## 2022-06-09 DIAGNOSIS — I10 Essential (primary) hypertension: Secondary | ICD-10-CM

## 2022-06-17 ENCOUNTER — Ambulatory Visit: Payer: Managed Care, Other (non HMO) | Admitting: Family Medicine

## 2022-06-17 ENCOUNTER — Encounter: Payer: Self-pay | Admitting: Family Medicine

## 2022-06-17 VITALS — BP 140/90 | HR 110 | Temp 100.9°F | Ht 71.0 in | Wt 232.0 lb

## 2022-06-17 DIAGNOSIS — J029 Acute pharyngitis, unspecified: Secondary | ICD-10-CM

## 2022-06-17 DIAGNOSIS — J101 Influenza due to other identified influenza virus with other respiratory manifestations: Secondary | ICD-10-CM | POA: Diagnosis not present

## 2022-06-17 LAB — POC COVID19 BINAXNOW: SARS Coronavirus 2 Ag: NEGATIVE

## 2022-06-17 LAB — POCT INFLUENZA A/B
Influenza A, POC: POSITIVE — AB
Influenza B, POC: NEGATIVE

## 2022-06-17 LAB — POCT RAPID STREP A (OFFICE): Rapid Strep A Screen: NEGATIVE

## 2022-06-17 MED ORDER — OSELTAMIVIR PHOSPHATE 75 MG PO CAPS: 75.0000 mg | ORAL_CAPSULE | Freq: Two times a day (BID) | ORAL | 0 refills | Status: DC

## 2022-06-17 NOTE — Progress Notes (Signed)
Acute Office Visit  Subjective:     Patient ID: Jean Yu, female    DOB: 1980/12/09, 41 y.o.   MRN: [id number]  Chief Complaint  Patient presents with   Sore Throat    Sore Throat  Associated symptoms include congestion, coughing and shortness of breath.   Patient is in today for acute visit.  Pt reports she has had malaise and fatigue since yesterday. She says she has some uneasiness in her chest along with sinus pressure. She woke up with fever of 100.9.  She reports chest congestion with SOB at times. She says she felt 'shaky' so had to get up from chair and lay down in the exam room. Pt reports her whole body hurts. She reports hx of HTN and has been 130/80s with home checks. She has scratchy sore throat also. She took tylenol arthritis for discomfort. BP elevated initially but at recheck was better 145/90. Pt also uses daily multivitamins and has hx of MS.   Past Medical History:  Diagnosis Date   Borderline high cholesterol    Hypertension    MS (multiple sclerosis) (HCC) 12/09/2005   Obesity    OSA (obstructive sleep apnea)     Review of Systems  Constitutional:  Positive for chills, fever and malaise/fatigue.  HENT:  Positive for congestion and sore throat.   Eyes:  Negative for pain.  Respiratory:  Positive for cough, sputum production and shortness of breath. Negative for wheezing.   Cardiovascular:  Negative for chest pain.  All other systems reviewed and are negative.       Objective:    BP (!) 140/90 (BP Location: Left Arm, Patient Position: Sitting, Cuff Size: Normal)   Pulse (!) 110   Temp (!) 100.9 F (38.3 C)   Ht 5\' 11"  (1.803 m)   Wt 232 lb (105.2 kg)   SpO2 99%   BMI 32.36 kg/m    Physical Exam Vitals and nursing note reviewed.  Constitutional:      Appearance: She is well-developed and normal weight. She is ill-appearing.  HENT:     Head: Normocephalic and atraumatic.     Right Ear: Tympanic membrane and ear canal normal.      Left Ear: Tympanic membrane and ear canal normal.     Mouth/Throat:     Mouth: Mucous membranes are moist. No oral lesions.     Pharynx: Oropharynx is clear. No pharyngeal swelling.     Tonsils: No tonsillar exudate or tonsillar abscesses.  Eyes:     Conjunctiva/sclera: Conjunctivae normal.     Pupils: Pupils are equal, round, and reactive to light.  Cardiovascular:     Rate and Rhythm: Regular rhythm. Tachycardia present.     Heart sounds: Normal heart sounds.  Pulmonary:     Effort: Pulmonary effort is normal. No respiratory distress.     Breath sounds: Normal breath sounds. No wheezing or rhonchi.  Skin:    General: Skin is warm.     Capillary Refill: Capillary refill takes less than 2 seconds.  Neurological:     General: No focal deficit present.     Mental Status: She is alert and oriented to person, place, and time.  Psychiatric:        Mood and Affect: Mood normal.        Behavior: Behavior normal.    Results for orders placed or performed in visit on 06/17/22  POCT rapid strep A  Result Value Ref Range   Rapid Strep A  Screen Negative Negative  POCT Influenza A/B  Result Value Ref Range   Influenza A, POC Positive (A) Negative   Influenza B, POC Negative Negative  POC COVID-19  Result Value Ref Range   SARS Coronavirus 2 Ag Negative Negative        Assessment & Plan:   Problem List Items Addressed This Visit   None Visit Diagnoses     Sore throat    -  Primary   Relevant Orders   POCT rapid strep A (Completed)   POCT Influenza A/B (Completed)   POC COVID-19 (Completed)   Influenza A       Relevant Medications   oseltamivir (TAMIFLU) 75 MG capsule       Meds ordered this encounter  Medications   oseltamivir (TAMIFLU) 75 MG capsule    Sig: Take 1 capsule (75 mg total) by mouth 2 (two) times daily.    Dispense:  10 capsule    Refill:  0   Strep and covid negative. Positive for flu Sent Tamiflu 75mg  bid x 5 days Rest and fluids along with  vitamin C, zinc.  No follow-ups on file.  Leeanne Rio, MD

## 2022-06-18 ENCOUNTER — Telehealth: Payer: Managed Care, Other (non HMO) | Admitting: Family Medicine

## 2022-06-18 DIAGNOSIS — J111 Influenza due to unidentified influenza virus with other respiratory manifestations: Secondary | ICD-10-CM | POA: Diagnosis not present

## 2022-06-18 MED ORDER — PREDNISONE 20 MG PO TABS: 20.0000 mg | ORAL_TABLET | Freq: Two times a day (BID) | ORAL | 0 refills | Status: AC

## 2022-06-18 NOTE — Progress Notes (Signed)
Virtual Visit Consent   Chanell Nadeau, you are scheduled for a virtual visit with a [facility] provider today. Just as with appointments in the office, your consent must be obtained to participate. Your consent will be active for this visit and any virtual visit you may have with one of our providers in the next 365 days. If you have a MyChart account, a copy of this consent can be sent to you electronically.  As this is a virtual visit, video technology does not allow for your provider to perform a traditional examination. This may limit your provider's ability to fully assess your condition. If your provider identifies any concerns that need to be evaluated in person or the need to arrange testing (such as labs, EKG, etc.), we will make arrangements to do so. Although advances in technology are sophisticated, we cannot ensure that it will always work on either your end or our end. If the connection with a video visit is poor, the visit may have to be switched to a telephone visit. With either a video or telephone visit, we are not always able to ensure that we have a secure connection.  By engaging in this virtual visit, you consent to the provision of healthcare and authorize for your insurance to be billed (if applicable) for the services provided during this visit. Depending on your insurance coverage, you may receive a charge related to this service.  I need to obtain your verbal consent now. Are you willing to proceed with your visit today? Jean Yu has provided verbal consent on 06/18/2022 for a virtual visit (video or telephone). Georgana Curio, FNP  Date: 06/18/2022 4:40 PM  Virtual Visit via Video Note   I, Georgana Curio, connected with  Jean Yu  ([id number], [birth year]) on 06/18/22 at  4:30 PM EST by a video-enabled telemedicine application and verified that I am speaking with the correct person using two identifiers.  Location: Patient: Virtual Visit  Location Patient: Home Provider: Virtual Visit Location Provider: Home Office   I discussed the limitations of evaluation and management by telemedicine and the availability of in person appointments. The patient expressed understanding and agreed to proceed.    History of Present Illness: Jean Yu is a 41 y.o. who identifies as a female who was assigned female at birth, and is being seen today for influenza, sx worse today, uses cpap, influenza dx yesterday, sx started Wednesday. Cough wheezing sob, .  HPI: HPI  Problems:  Patient Active Problem List   Diagnosis Date Noted   Localized swelling of left lower leg 03/24/2022   Right proximal hamstring tendon rupture, sequela 02/19/2022   OSA on CPAP 02/19/2022   Labile hypertension 02/17/2022   Brain fog 02/01/2022   Acute otitis externa of right ear 02/01/2022   Bunion of great toe of left foot 07/21/2021   Bunion of great toe of right foot 07/21/2021   Left-sided chest pain 07/21/2021   Gastroesophageal reflux disease without esophagitis 07/21/2021   Hemorrhoids 11/20/2020   Choking 11/20/2020   Sinus drainage 11/20/2020   Left hip pain 09/16/2020   Anemia 07/15/2020   Hypokalemia 07/15/2020   Right-sided chest pain 07/15/2020   Burping 07/15/2020   Indigestion 07/15/2020   Hypotension due to drugs 07/10/2020   Primary osteoarthritis of left hip 03/28/2020   Acute intractable headache 02/27/2020   Acute reaction to stress 02/20/2020   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   Premature ventricular contractions (PVCs) (VPCs) 02/20/2020  Osteoarthritis of left hip 01/09/2020   Degenerative disc disease, lumbar 08/23/2019   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   Left arm pain 05/11/2019   Palpitations 05/11/2019   Breast skin changes 05/11/2019   Breast tenderness in female 05/11/2019   Left leg pain 02/17/2018   High risk medication use 11/16/2017   Large breasts  08/29/2017   Class 1 obesity due to excess calories without serious comorbidity with body mass index (BMI) of 34.0 to 34.9 in adult 08/29/2017   Obstructive sleep apnea 05-08-2017   Family history of sudden cardiac death in mother May 08, 2017   Chest pain with low risk of acute coronary syndrome 08-May-2017   BMI 33.0-33.9,adult 03/29/2017   Carpal tunnel syndrome 09/18/2015   Circadian rhythm sleep disorder, 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   Fatigue 09/18/2015   Memory loss 09/18/2015   Numbness and tingling in both hands 09/18/2015   Unsteady gait 09/18/2015   Oligomenorrhea 04/29/2014   Vitamin D insufficiency 01/01/2013   Primary hypertension 12/18/2008   MS (multiple sclerosis) (HCC) 12/09/2005    Allergies:  Allergies  Allergen Reactions   Celebrex [Celecoxib]     GI upset and darker stools   Hydralazine     Hypotension and syncope   Medications:  Current Outpatient Medications:    Ascorbic Acid (VITAMIN C) 1000 MG tablet, Take 1,000 mg by mouth daily., Disp: , Rfl:    BLACK ELDERBERRY PO, Take by mouth., Disp: , Rfl:    cholecalciferol (VITAMIN D) 1000 units tablet, Take 1,000 Units by mouth daily., Disp: , Rfl:    cloNIDine (CATAPRES) 0.1 MG tablet, Take one tablet as needed up to twice a day when BP is over 160/90., Disp: 30 tablet, Rfl: 0   diclofenac Sodium (VOLTAREN) 1 % GEL, Apply 4 g topically 4 (four) times daily. To affected joint., Disp: 100 g, Rfl: 2   FENUGREEK PO, Take by mouth., Disp: , Rfl:    furosemide (LASIX) 20 MG tablet, Take one tablet as needed for left leg swelling, Disp: 30 tablet, Rfl: 1   metoprolol succinate (TOPROL-XL) 100 MG 24 hr tablet, Take 1 tablet by mouth once daily, Disp: 90 tablet, Rfl: 0   oseltamivir (TAMIFLU) 75 MG capsule, Take 1 capsule (75 mg total) by mouth 2 (two) times daily., Disp: 10 capsule, Rfl: 0   triamterene-hydrochlorothiazide (DYAZIDE) 37.5-25 MG capsule, Take 1 capsule by mouth once daily, Disp: 90 capsule, Rfl: 0   Turmeric  500 MG CAPS, Take 500 mg by mouth in the morning and at bedtime., Disp: , Rfl:   Observations/Objective: Patient is well-developed, well-nourished in no acute distress.  Resting comfortably  at home.  Head is normocephalic, atraumatic.  No labored breathing.  Speech is clear and coherent with logical content.  Patient is alert and oriented at baseline.    Assessment and Plan: 1. Influenza  Increase fluids, continue tamiflu,   Follow Up Instructions: I discussed the assessment and treatment plan with the patient. The patient was provided an opportunity to ask questions and all were answered. The patient agreed with the plan and demonstrated an understanding of the instructions.  A copy of instructions were sent to the patient via MyChart unless otherwise noted below.     The patient was advised to call back or seek an in-person evaluation if the symptoms worsen or if the condition fails to improve as anticipated.  Time:  I spent 10 minutes with the patient via telehealth technology discussing the above  problems/concerns.    Georgana Curio, FNP

## 2022-06-18 NOTE — Patient Instructions (Signed)

## 2022-06-21 ENCOUNTER — Encounter: Payer: Self-pay | Admitting: Family Medicine

## 2022-06-22 ENCOUNTER — Encounter: Payer: Self-pay | Admitting: Family Medicine

## 2022-06-29 NOTE — Progress Notes (Deleted)
Referring-[name] Breeback PA-C Reason for referral-hypertension  HPI: 42 year old female for evaluation of hypertension at request of Iran Planas PA-C.  Patient seen previously for palpitations but not since July 11, 2019.  Echocardiogram July 20, 2019 showed normal LV function.  Monitor February 2021 showed sinus rhythm.  Current Outpatient Medications  Medication Sig Dispense Refill   Ascorbic Acid (VITAMIN C) 1000 MG tablet Take 1,000 mg by mouth daily.     BLACK ELDERBERRY PO Take by mouth.     cholecalciferol (VITAMIN D) 1000 units tablet Take 1,000 Units by mouth daily.     cloNIDine (CATAPRES) 0.1 MG tablet Take one tablet as needed up to twice a day when BP is over 160/90. 30 tablet 0   diclofenac Sodium (VOLTAREN) 1 % GEL Apply 4 g topically 4 (four) times daily. To affected joint. 100 g 2   FENUGREEK PO Take by mouth.     furosemide (LASIX) 20 MG tablet Take one tablet as needed for left leg swelling 30 tablet 1   metoprolol succinate (TOPROL-XL) 100 MG 24 hr tablet Take 1 tablet by mouth once daily 90 tablet 0   oseltamivir (TAMIFLU) 75 MG capsule Take 1 capsule (75 mg total) by mouth 2 (two) times daily. 10 capsule 0   triamterene-hydrochlorothiazide (DYAZIDE) 37.5-25 MG capsule Take 1 capsule by mouth once daily 90 capsule 0   Turmeric 500 MG CAPS Take 500 mg by mouth in the morning and at bedtime.     No current facility-administered medications for this visit.    Allergies  Allergen Reactions   Celebrex [Celecoxib]     GI upset and darker stools   Hydralazine     Hypotension and syncope     Past Medical History:  Diagnosis Date   Borderline high cholesterol    Hypertension    MS (multiple sclerosis) ([place]) 12/09/2005   Obesity    OSA (obstructive sleep apnea)     Past Surgical History:  Procedure Laterality Date   No prior surgery      Social History   Socioeconomic History   Marital status: Single    Spouse name: Not on file   Number of  children: Not on file   Years of education: Not on file   Highest education level: Not on file  Occupational History   Not on file  Tobacco Use   Smoking status: Former   Smokeless tobacco: Never  Vaping Use   Vaping Use: Never used  Substance and Sexual Activity   Alcohol use: Yes    Alcohol/week: 0.0 standard drinks of alcohol    Comment: Rarely   Drug use: No   Sexual activity: Not Currently  Other Topics Concern   Not on file  Social History Narrative   Not on file   Social Determinants of Health   Financial Resource Strain: Not on file  Food Insecurity: Not on file  Transportation Needs: Not on file  Physical Activity: Not on file  Stress: Not on file  Social Connections: Not on file  Intimate Partner Violence: Not on file    Family History  Problem Relation Age of Onset   Hypertension Mother    Heart attack Mother    Cancer Maternal Aunt     ROS: no fevers or chills, productive cough, hemoptysis, dysphasia, odynophagia, melena, hematochezia, dysuria, hematuria, rash, seizure activity, orthopnea, PND, pedal edema, claudication. Remaining systems are negative.  Physical Exam:   There were no vitals taken for this visit.  General:  Well developed/well nourished in NAD Skin warm/dry Patient not depressed No peripheral clubbing Back-normal HEENT-normal/normal eyelids Neck supple/normal carotid upstroke bilaterally; no bruits; no JVD; no thyromegaly chest - CTA/ normal expansion CV - RRR/normal S1 and S2; no murmurs, rubs or gallops;  PMI nondisplaced Abdomen -NT/ND, no HSM, no mass, + bowel sounds, no bruit 2+ femoral pulses, no bruits Ext-no edema, chords, 2+ DP Neuro-grossly nonfocal  ECG - personally reviewed  A/P  1 hypertension-  2 history of palpitations-previous evaluation included an echocardiogram showing normal LV function and a monitor showing no significant arrhythmias.  Symptoms have improved.  Will continue metoprolol at present  dose.  Kirk Ruths, MD

## 2022-07-12 ENCOUNTER — Ambulatory Visit: Payer: Managed Care, Other (non HMO) | Admitting: Cardiology

## 2022-07-12 ENCOUNTER — Encounter: Payer: Self-pay | Admitting: Physician Assistant

## 2022-07-12 ENCOUNTER — Ambulatory Visit: Payer: Managed Care, Other (non HMO) | Admitting: Physician Assistant

## 2022-07-12 VITALS — BP 133/104 | HR 94 | Ht 71.0 in | Wt 232.1 lb

## 2022-07-12 DIAGNOSIS — J0100 Acute maxillary sinusitis, unspecified: Secondary | ICD-10-CM | POA: Diagnosis not present

## 2022-07-12 DIAGNOSIS — R52 Pain, unspecified: Secondary | ICD-10-CM | POA: Diagnosis not present

## 2022-07-12 DIAGNOSIS — J029 Acute pharyngitis, unspecified: Secondary | ICD-10-CM | POA: Diagnosis not present

## 2022-07-12 DIAGNOSIS — J101 Influenza due to other identified influenza virus with other respiratory manifestations: Secondary | ICD-10-CM | POA: Diagnosis not present

## 2022-07-12 LAB — POCT INFLUENZA A/B
Influenza A, POC: NEGATIVE
Influenza B, POC: POSITIVE — AB

## 2022-07-12 MED ORDER — FLUTICASONE PROPIONATE 50 MCG/ACT NA SUSP: 2.0000 | Freq: Every day | NASAL | 0 refills | Status: DC

## 2022-07-12 MED ORDER — AMOXICILLIN-POT CLAVULANATE 875-125 MG PO TABS: 1.0000 | ORAL_TABLET | Freq: Two times a day (BID) | ORAL | 0 refills | Status: DC

## 2022-07-12 NOTE — Progress Notes (Signed)
Established Patient Office Visit  Subjective   Patient ID: Jean Yu, female    DOB: 06/30/1980  Age: 42 y.o. MRN: [id number]  Chief Complaint  Patient presents with   Acute throat    Both ears stopped up. Left is worse. Patient reported runny nose    HPI Pt is a 42 yo obese female with flu like symptoms starting 4 days ago. She had influenza A on 06/17/2022. She took tamiflu and prednisone. She finally started feeling better and then Thursday more flu like symptoms hit her. She has fatigue, ST, body aches, head pressure, ear pain. She cannot use her CPAP due to congestion. She is taking Coriciden HBP.   [name]. Active Ambulatory Problems    Diagnosis Date Noted   Primary hypertension 12/18/2008   MS (multiple sclerosis) ([place]) 12/09/2005   BMI 33.0-33.9,adult 03/29/2017   Obstructive sleep apnea 05/16/2017   Family history of sudden cardiac death in mother 05-16-2017   Chest pain with low risk of acute coronary syndrome May 16, 2017   Large breasts 08/29/2017   Class 1 obesity due to excess calories without serious comorbidity with body mass index (BMI) of 34.0 to 34.9 in adult 08/29/2017   Left arm pain 05/11/2019   Palpitations 05/11/2019   Breast skin changes 05/11/2019   Breast tenderness in female 05/11/2019   Osteoarthritis of left hip 01/09/2020   Acute reaction to stress 02/20/2020   Carpal tunnel syndrome 09/18/2015   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   Circadian rhythm sleep disorder, 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   Degenerative disc disease, lumbar 08/23/2019   Fatigue 09/18/2015   High risk medication use 11/16/2017   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   Left leg pain 02/17/2018   Memory loss 09/18/2015   Numbness and tingling in both hands 09/18/2015   Oligomenorrhea 04/29/2014   Premature ventricular contractions (PVCs) (VPCs) 02/20/2020   Unsteady gait 09/18/2015   Vitamin D insufficiency 01/01/2013    Acute intractable headache 02/27/2020   Primary osteoarthritis of left hip 03/28/2020   Hypotension due to drugs 07/10/2020   Anemia 07/15/2020   Hypokalemia 07/15/2020   Right-sided chest pain 07/15/2020   Burping 07/15/2020   Indigestion 07/15/2020   Left hip pain 09/16/2020   Hemorrhoids 11/20/2020   Choking 11/20/2020   Sinus drainage 11/20/2020   Bunion of great toe of left foot 07/21/2021   Bunion of great toe of right foot 07/21/2021   Left-sided chest pain 07/21/2021   Gastroesophageal reflux disease without esophagitis 07/21/2021   Brain fog 02/01/2022   Acute otitis externa of right ear 02/01/2022   Labile hypertension 02/17/2022   Right proximal hamstring tendon rupture, sequela 02/19/2022   OSA on CPAP 02/19/2022   Localized swelling of left lower leg 03/24/2022   Resolved Ambulatory Problems    Diagnosis Date Noted   PHLEBITIS&THROMBOPHLEB SUP VESSELS LOWER EXTREM 12/18/2008   CRAMP IN LIMB 12/18/2008   EDEMA LEG 12/18/2008   Bright red blood per rectum 11/20/2020   Past Medical History:  Diagnosis Date   Borderline high cholesterol    Hypertension    Obesity    OSA (obstructive sleep apnea)     ROS See HPI.    Objective:     BP (!) 133/104 (BP Location: Left Arm, Patient Position: Sitting, Cuff Size: Large)   Pulse 94   Ht 5\' 11"  (1.803 m)   Wt 232 lb 1.3 oz (105.3 kg)   SpO2 99%   BMI  32.37 kg/m  BP Readings from Last 3 Encounters:  07/12/22 (!) 133/104  06/17/22 (!) 140/90  03/24/22 (!) 138/98   Wt Readings from Last 3 Encounters:  07/12/22 232 lb 1.3 oz (105.3 kg)  06/17/22 232 lb (105.2 kg)  03/24/22 247 lb (112 kg)    . Results for orders placed or performed in visit on 07/12/22  POCT Influenza A/B  Result Value Ref Range   Influenza A, POC Negative Negative   Influenza B, POC Positive (A) Negative     Physical Exam Constitutional:      Appearance: Normal appearance. She is obese.  HENT:     Head: Normocephalic.     Right  Ear: Tympanic membrane, ear canal and external ear normal.     Left Ear: Ear canal and external ear normal.     Ears:     Comments: TM erythematous with no light reflex and dull membrane.     Nose: Rhinorrhea present. No congestion.     Mouth/Throat:     Mouth: Mucous membranes are moist.     Pharynx: Posterior oropharyngeal erythema present. No oropharyngeal exudate.     Comments: Petechia over oropharynx.  Cardiovascular:     Rate and Rhythm: Normal rate and regular rhythm.     Pulses: Normal pulses.  Pulmonary:     Effort: Pulmonary effort is normal.     Breath sounds: Normal breath sounds.  Musculoskeletal:     Cervical back: Normal range of motion and neck supple. No tenderness.     Right lower leg: No edema.     Left lower leg: No edema.  Lymphadenopathy:     Cervical: No cervical adenopathy.  Neurological:     General: No focal deficit present.     Mental Status: She is alert.  Psychiatric:        Mood and Affect: Mood normal.        Assessment & Plan:  Marland KitchenMarland KitchenAissata was seen today for acute throat.  Diagnoses and all orders for this visit:  Influenza B  Acute non-recurrent maxillary sinusitis -     amoxicillin-clavulanate (AUGMENTIN) 875-125 MG tablet; Take 1 tablet by mouth 2 (two) times daily. For 10 days. -     fluticasone (FLONASE) 50 MCG/ACT nasal spray; Place 2 sprays into both nostrils daily.  Body aches -     POCT Influenza A/B  Sore throat -     Cancel: POCT rapid strep A   Out of window for tamiflu  At the day 5 mark Wear a mask until feeling 100 percent Suspect secondary sinusitis sent augmentin for 10 days with flonase Stay hydrated Follow up as needed if symptoms persist or worsen [place] Planas, PA-C

## 2022-07-12 NOTE — Patient Instructions (Signed)

## 2022-07-26 ENCOUNTER — Encounter: Payer: Self-pay | Admitting: Physician Assistant

## 2022-07-26 MED ORDER — FLUCONAZOLE 150 MG PO TABS: 150.0000 mg | ORAL_TABLET | Freq: Once | ORAL | 0 refills | Status: AC

## 2022-08-15 IMAGING — US US EXTREM LOW VENOUS
1 series · 13 of 24 positions shown · non-contrast
Comparison: Left lower extremity venous Doppler
ultrasound-07/02/2019;

CLINICAL DATA: Bilateral lower extremity pain and edema since last
evening. Evaluate for DVT.



[Series 1: us extrem low venous · 0.08mm/px · 13 of 71 slices shown]
[im 1/71]
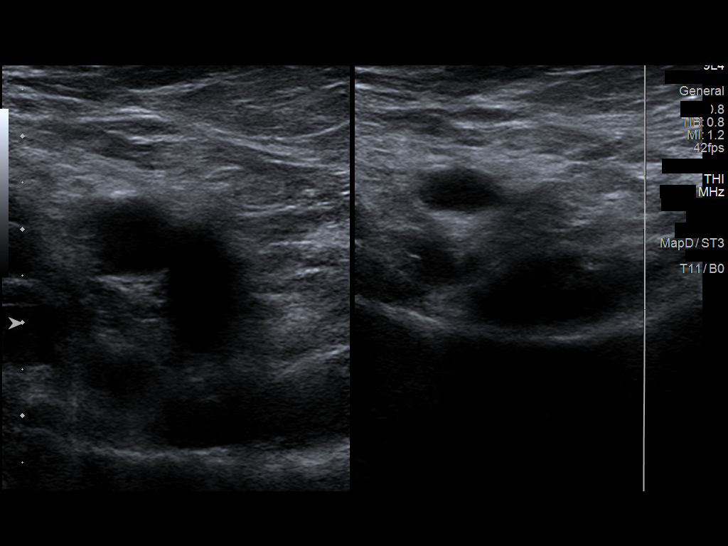
[im 7/71]
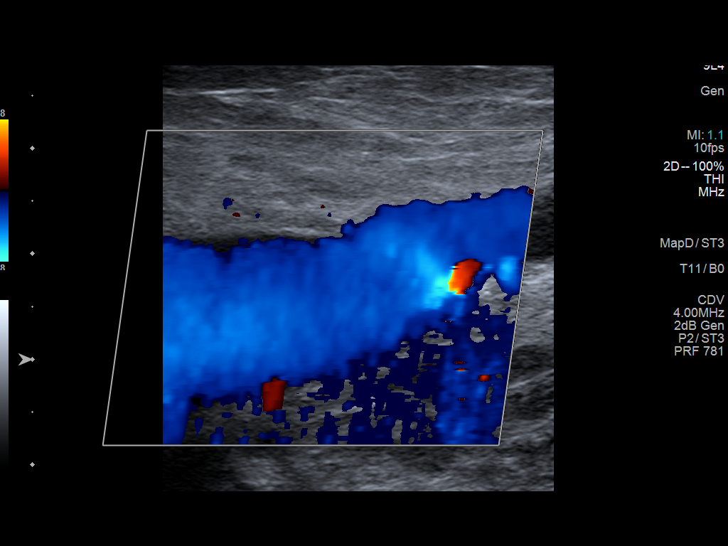
[im 13/71]
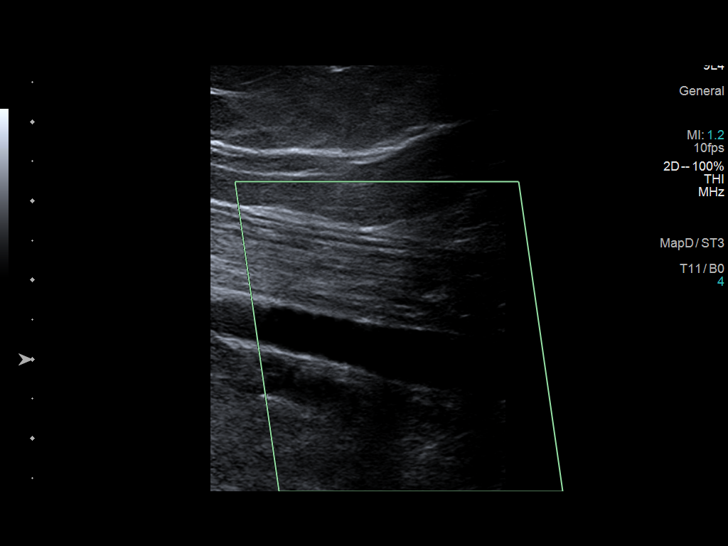
[im 19/71]
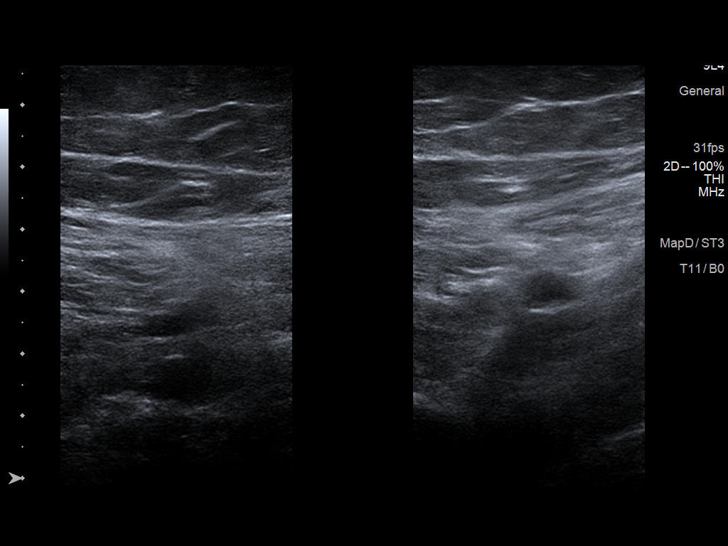
[im 25/71]
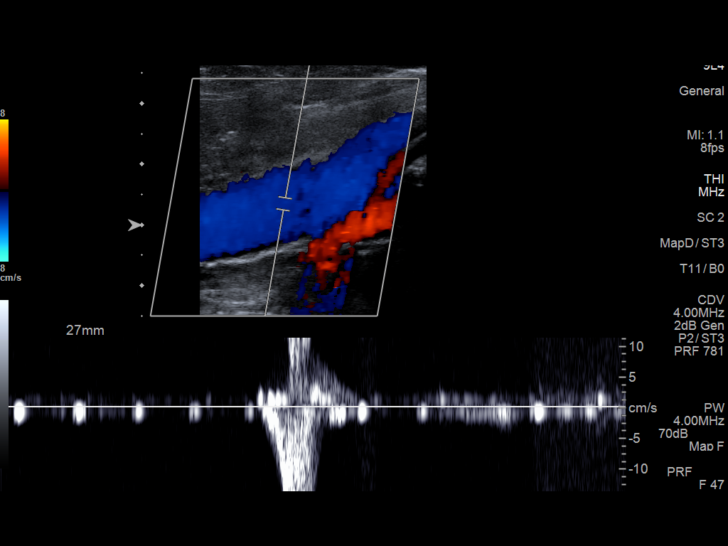
[im 31/71]
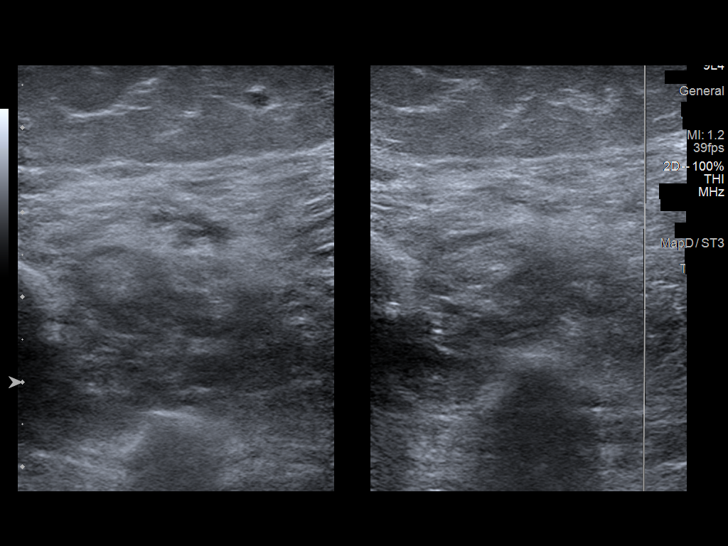
[im 37/71]
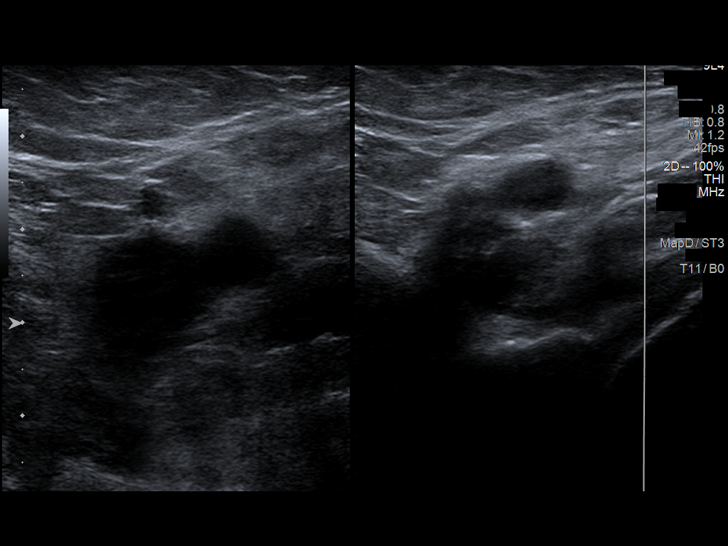
[im 40/71]
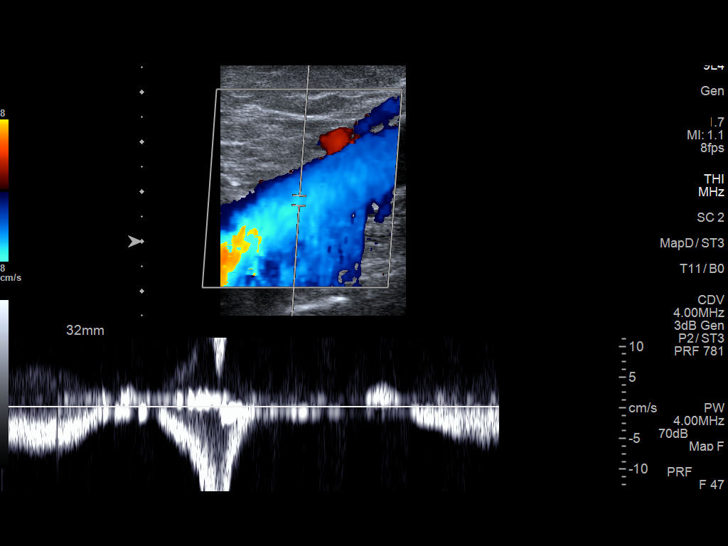
[im 46/71]
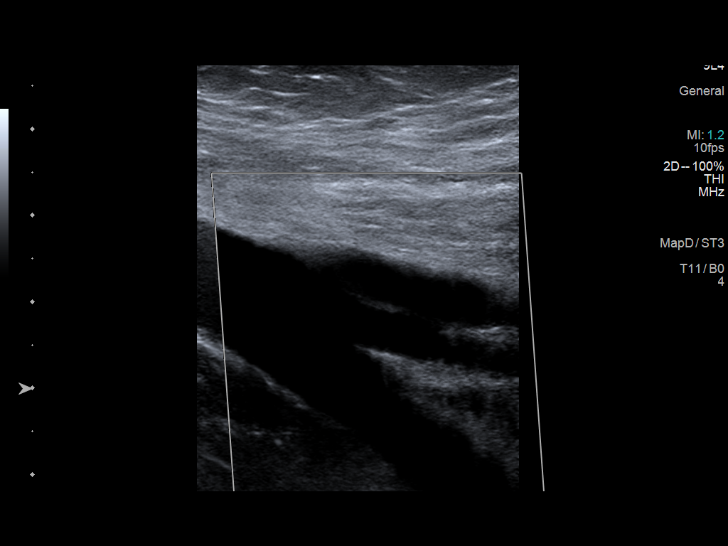
[im 52/71]
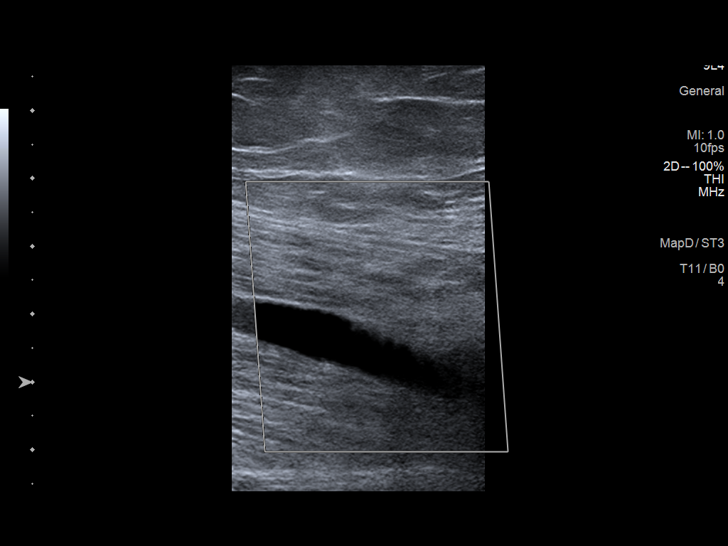
[im 58/71]
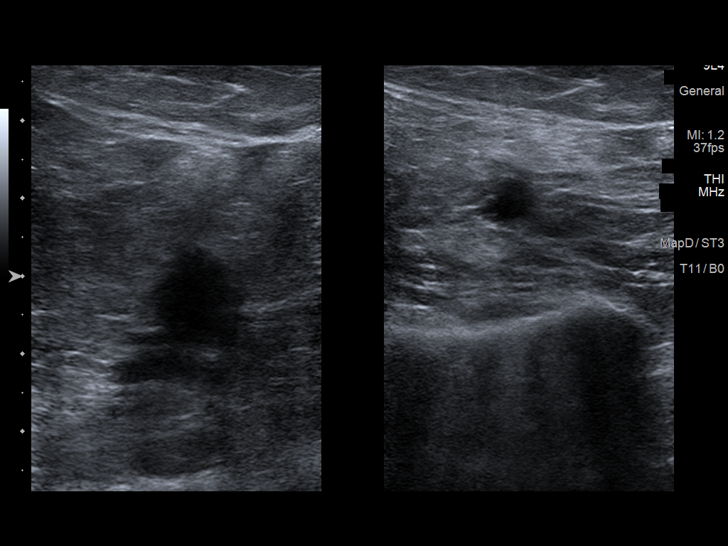
[im 64/71]
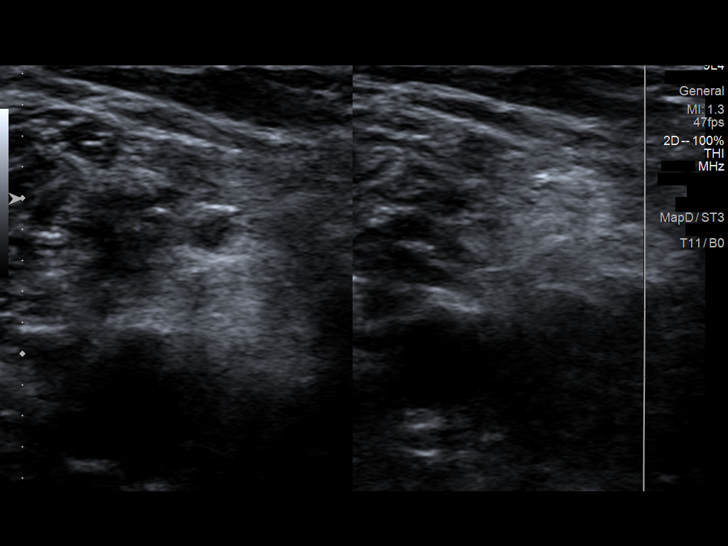
[im 71/71]
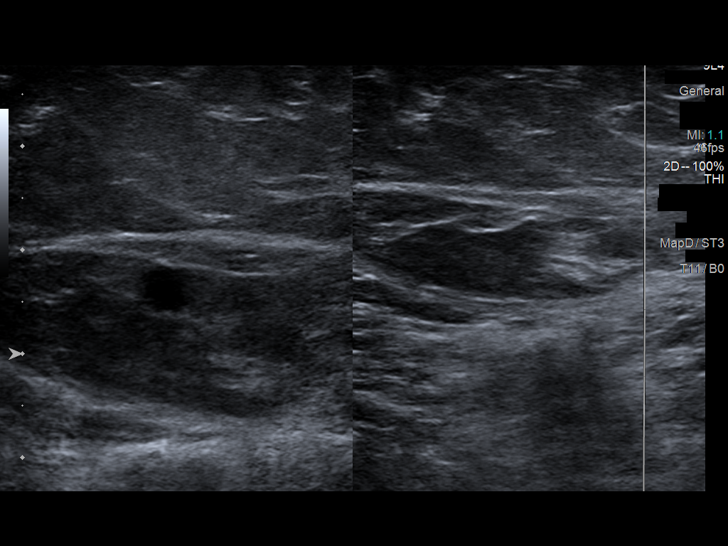

[13 of 24 positions shown; findings below may reference images not displayed]

01/31/2018 (both negative).

Right lower extremity venous Doppler ultrasound-12/18/2008
(negative)
FINDINGS: RIGHT LOWER EXTREMITY

Common Femoral Vein: No evidence of thrombus. Normal
compressibility, respiratory phasicity and response to augmentation.

Saphenofemoral Junction: No evidence of thrombus. Normal
compressibility and flow on color Doppler imaging.

Profunda Femoral Vein: No evidence of thrombus. Normal
compressibility and flow on color Doppler imaging.

Femoral Vein: No evidence of thrombus. Normal compressibility,
respiratory phasicity and response to augmentation.

Popliteal Vein: No evidence of thrombus. Normal compressibility,
respiratory phasicity and response to augmentation.

Calf Veins: No evidence of thrombus. Normal compressibility and flow
on color Doppler imaging.

Superficial Great Saphenous Vein: No evidence of thrombus. Normal
compressibility.

Venous Reflux:  None.

Other Findings:  None.

LEFT LOWER EXTREMITY

Common Femoral Vein: No evidence of thrombus. Normal
compressibility, respiratory phasicity and response to augmentation.

Saphenofemoral Junction: No evidence of thrombus. Normal
compressibility and flow on color Doppler imaging.

Profunda Femoral Vein: No evidence of thrombus. Normal
compressibility and flow on color Doppler imaging.

Femoral Vein: No evidence of thrombus. Normal compressibility,
respiratory phasicity and response to augmentation.

Popliteal Vein: No evidence of thrombus. Normal compressibility,
respiratory phasicity and response to augmentation.

Calf Veins: No evidence of thrombus. Normal compressibility and flow
on color Doppler imaging.

Superficial Great Saphenous Vein: No evidence of thrombus. Normal
compressibility.

Venous Reflux:  None.

Other Findings:  None.
IMPRESSION: No evidence of DVT within either lower extremity.

## 2022-09-03 ENCOUNTER — Ambulatory Visit: Payer: Managed Care, Other (non HMO)

## 2022-09-03 ENCOUNTER — Encounter: Payer: Self-pay | Admitting: Family Medicine

## 2022-09-03 ENCOUNTER — Ambulatory Visit (INDEPENDENT_AMBULATORY_CARE_PROVIDER_SITE_OTHER): Payer: Managed Care, Other (non HMO) | Admitting: Family Medicine

## 2022-09-03 VITALS — BP 132/89 | HR 73 | Ht 71.0 in | Wt 244.0 lb

## 2022-09-03 DIAGNOSIS — M79671 Pain in right foot: Secondary | ICD-10-CM

## 2022-09-03 NOTE — Progress Notes (Signed)
   Acute Office Visit  Subjective:     Patient ID: Jean Yu, female    DOB: 1980/07/12, 42 y.o.   MRN: [id number]  Chief Complaint  Patient presents with   bruise on foot    HPI Patient is in today for bruise on bottom of foot x 2 days it actually looked reddish-purple and was over the bottom part ball of her foot.  She says it is looking a little bit better today..  Was on her foot longer, 17 hours, and was wearing steel toe-d boots.  He says the shoes were actually comfortable there was not anything that felt ill fitting.  No trauma or injury.  No swelling.  Or stepping on anything.  She just feels like it still feels tender and sore.  ROS      Objective:    BP 132/89   Pulse 73   Ht 5\' 11"  (1.803 m)   Wt 244 lb (110.7 kg)   SpO2 98%   BMI 34.03 kg/m    Physical Exam Vitals reviewed.  Constitutional:      Appearance: She is well-developed.  HENT:     Head: Normocephalic and atraumatic.  Eyes:     Conjunctiva/sclera: Conjunctivae normal.  Cardiovascular:     Rate and Rhythm: Normal rate.  Pulmonary:     Effort: Pulmonary effort is normal.  Musculoskeletal:     Comments: No discrete swelling but she is tender over the mid to distal second and third metatarsals.  There is just a little bit of erythema at the ball of the foot.  But I do not palpate any firmness or lesion or abscess underneath the skin.  It does seem to be a lot smaller than the photo that she was able to show me from yesterday.  Skin:    General: Skin is dry.     Coloration: Skin is not pale.  Neurological:     Mental Status: She is alert and oriented to person, place, and time.  Psychiatric:        Behavior: Behavior normal.     No results found for any visits on 09/03/22.      Assessment & Plan:   Problem List Items Addressed This Visit   None Visit Diagnoses     Right foot pain    -  Primary   Relevant Orders   DG Foot Complete Right       Right foot pain-she is  tender over the second and third metatarsals so would like to get an x-ray just to rule out stress fracture.  Over the weekend just recommend ice elevate and use anti-inflammatory as needed.  If we get the results back and we see an injury then we can always use a postop shoe or boot if needed.  Will call with results once available.  It looks like the area of erythema seems to be getting a lot smaller on its own and I do not see a break in the skin which is reassuring.  No orders of the defined types were placed in this encounter.   No follow-ups on file.  Beatrice Lecher, MD

## 2022-09-03 NOTE — Patient Instructions (Signed)
Over the weekend try to elevate, ice and use Tylenol or Motrin as needed.  Will call you with the x-ray results once they are back.

## 2022-09-06 ENCOUNTER — Other Ambulatory Visit: Payer: Self-pay | Admitting: Pharmacist

## 2022-09-06 NOTE — Progress Notes (Signed)
HI Sheka, rate news!  They did not see any sign of fracture.  So lets just continue with elevation and icing try to reduce pressure on that foot is much as possible.  If it is not continuing to get better over the next week or 2 then we will get you in with our sports med doc for further workup.

## 2022-09-06 NOTE — Progress Notes (Signed)
Patient appearing on report for [employer] [facility] - Hypertension Control report due to last documented ambulatory blood pressure of 133/104 on 07/12/22. Next appointment with PCP is not scheduled.   Outreached patient to discuss hypertension control and medication management. Left voicemail for patient to return my call at their convenience.   Larinda Buttery, PharmD Clinical Pharmacist [facility] [phone]

## 2022-09-30 ENCOUNTER — Other Ambulatory Visit: Payer: Self-pay | Admitting: Physician Assistant

## 2022-09-30 DIAGNOSIS — I10 Essential (primary) hypertension: Secondary | ICD-10-CM

## 2022-10-18 DIAGNOSIS — S76311A Strain of muscle, fascia and tendon of the posterior muscle group at thigh level, right thigh, initial encounter: Secondary | ICD-10-CM | POA: Insufficient documentation

## 2022-10-21 ENCOUNTER — Ambulatory Visit (INDEPENDENT_AMBULATORY_CARE_PROVIDER_SITE_OTHER): Payer: Managed Care, Other (non HMO) | Admitting: Medical-Surgical

## 2022-10-21 ENCOUNTER — Encounter: Payer: Self-pay | Admitting: Medical-Surgical

## 2022-10-21 VITALS — BP 135/84 | HR 71 | Resp 20 | Ht 71.0 in | Wt 245.0 lb

## 2022-10-21 DIAGNOSIS — I10 Essential (primary) hypertension: Secondary | ICD-10-CM | POA: Diagnosis not present

## 2022-10-21 DIAGNOSIS — R0989 Other specified symptoms and signs involving the circulatory and respiratory systems: Secondary | ICD-10-CM

## 2022-10-21 LAB — CBC WITH DIFFERENTIAL/PLATELET
HCT: 39.8 % (ref 35.0–45.0)
MCHC: 32.4 g/dL (ref 32.0–36.0)
MPV: 12.3 fL (ref 7.5–12.5)
Monocytes Relative: 7.5 %

## 2022-10-21 MED ORDER — METOPROLOL SUCCINATE ER 100 MG PO TB24: ORAL_TABLET | ORAL | 1 refills | Status: DC

## 2022-10-21 NOTE — Progress Notes (Signed)
        Established patient visit  History, exam, impression, and plan:  1. Labile hypertension Very pleasant 42 year old female presenting today for discussion of labile hypertension.  She has been taking Toprol-XL 100 mg daily as prescribed, tolerating well.  Also on Dyazide.  She does have a long history of labile blood pressure and notes that on Monday, she went to a [employer]. appointment and her blood pressure was in the 170s/120s there.  The next day she had a neurology appointment and her blood pressure was 200/144.  Today she notes that her blood pressure has been in the 170s/100s.  She is compliant with her medications and avoids skipping doses.  She was sent home from work yesterday due to the high blood pressure but would like to go to work today if at all possible.  Denies CP, SOB, LE edema, headaches, dizziness, palpitations, and vision changes.  Has been exceedingly fatigued.  Notes that she has had a few episodes of sharp stabbing pain in the left upper arm that lasts for a few seconds up to a few minutes.  Unassociated with position, activity, or time of day.  On exam, HRRR, S1/S2 normal.  Pulses 2+, equal bilaterally.  Lungs CTA with even unlabored respirations.  Previously prescribed clonidine 0.1 mg twice daily as needed for blood pressure greater than 160/90.  She picked this up from the pharmacy however the pharmacist advised her to make sure that she has someone around her when taking this medication because it could "bottom her out".  This made her very anxious and she has not taken the medicine.  Checking labs as below.  After discussion, recommend continuing Dyazide as prescribed.  Heart rate is stable so rather than introduce a new medication, plan to maximize Toprol-XL to 100 mg twice daily.  Advised to obtain a new blood pressure cuff since hers broke.  Return in 2 weeks for nurse visit to check blood pressure.  Monitor blood pressure at home and bring her readings with her.   Recommend bringing the cuff with her so we can verify it for accuracy against ours.  Patient verbalized understanding is agreeable to the plan. - COMPLETE METABOLIC PANEL WITH GFR - TSH - CBC with Differential/Platelet - Magnesium - metoprolol succinate (TOPROL-XL) 100 MG 24 hr tablet; TAKE 1 TABLET BY MOUTH TWICE DAILY.  Dispense: 180 tablet; Refill: 1   Procedures performed this visit: None.  Return in about 2 weeks (around 11/04/2022) for HTN follow up.  __________________________________ Thayer Ohm, DNP, APRN, FNP-BC Primary Care and Sports Medicine [facility]

## 2022-10-22 LAB — CBC WITH DIFFERENTIAL/PLATELET
Absolute Monocytes: 300 cells/uL (ref 200–950)
Basophils Absolute: 32 cells/uL (ref 0–200)
Basophils Relative: 0.8 %
Eosinophils Absolute: 40 cells/uL (ref 15–500)
Eosinophils Relative: 1 %
Hemoglobin: 12.9 g/dL (ref 11.7–15.5)
Lymphs Abs: 1584 cells/uL (ref 850–3900)
MCH: 27.9 pg (ref 27.0–33.0)
MCV: 86 fL (ref 80.0–100.0)
Neutro Abs: 2044 cells/uL (ref 1500–7800)
Neutrophils Relative %: 51.1 %
Platelets: 190 10*3/uL (ref 140–400)
RBC: 4.63 10*6/uL (ref 3.80–5.10)
RDW: 11.9 % (ref 11.0–15.0)
Total Lymphocyte: 39.6 %
WBC: 4 10*3/uL (ref 3.8–10.8)

## 2022-10-22 LAB — COMPLETE METABOLIC PANEL WITH GFR
AG Ratio: 1.4 (calc) (ref 1.0–2.5)
ALT: 9 U/L (ref 6–29)
AST: 13 U/L (ref 10–30)
Albumin: 4.3 g/dL (ref 3.6–5.1)
Alkaline phosphatase (APISO): 51 U/L (ref 31–125)
BUN: 14 mg/dL (ref 7–25)
CO2: 28 mmol/L (ref 20–32)
Calcium: 9.8 mg/dL (ref 8.6–10.2)
Chloride: 101 mmol/L (ref 98–110)
Creat: 0.71 mg/dL (ref 0.50–0.99)
Globulin: 3.1 g/dL (calc) (ref 1.9–3.7)
Glucose, Bld: 69 mg/dL (ref 65–99)
Potassium: 3.9 mmol/L (ref 3.5–5.3)
Sodium: 138 mmol/L (ref 135–146)
Total Bilirubin: 0.5 mg/dL (ref 0.2–1.2)
Total Protein: 7.4 g/dL (ref 6.1–8.1)
eGFR: 109 mL/min/{1.73_m2} (ref >=60)

## 2022-10-22 LAB — MAGNESIUM: Magnesium: 1.8 mg/dL (ref 1.5–2.5)

## 2022-10-22 LAB — TSH: TSH: 0.74 mIU/L

## 2022-11-02 ENCOUNTER — Other Ambulatory Visit: Payer: Self-pay | Admitting: Physician Assistant

## 2022-11-02 DIAGNOSIS — I10 Essential (primary) hypertension: Secondary | ICD-10-CM

## 2022-11-04 ENCOUNTER — Ambulatory Visit (INDEPENDENT_AMBULATORY_CARE_PROVIDER_SITE_OTHER): Payer: Managed Care, Other (non HMO) | Admitting: Medical-Surgical

## 2022-11-04 DIAGNOSIS — Z91199 Patient's noncompliance with other medical treatment and regimen due to unspecified reason: Secondary | ICD-10-CM

## 2022-11-04 NOTE — Progress Notes (Signed)
   Complete physical exam  Patient: Jean Yu   DOB: 04/17/1999   42 y.o. Female  MRN: [id number]  Subjective:    No chief complaint on file.   Jean Yu is a 42 y.o. female who presents today for a complete physical exam. She reports consuming a {diet types:17450} diet. {types:19826} She generally feels {DESC; WELL/FAIRLY WELL/POORLY:18703}. She reports sleeping {DESC; WELL/FAIRLY WELL/POORLY:18703}. She {does/does not:200015} have additional problems to discuss today.    Most recent fall risk assessment:    12/23/2021   10:42 AM  Fall Risk   Falls in the past year? 0  Number falls in past yr: 0  Injury with Fall? 0  Risk for fall due to : No Fall Risks  Follow up Falls evaluation completed     Most recent depression screenings:    12/23/2021   10:42 AM 11/13/2020   10:46 AM  PHQ 2/9 Scores  PHQ - 2 Score 0 0  PHQ- 9 Score 5     {VISON DENTAL STD PSA (Optional):27386}  {History (Optional):23778}  Patient Care Team: Tysen Roesler, NP as PCP - General (Nurse Practitioner)   Outpatient Medications Prior to Visit  Medication Sig   fluticasone (FLONASE) 50 MCG/ACT nasal spray Place 2 sprays into both nostrils in the morning and at bedtime. After 7 days, reduce to once daily.   norgestimate-ethinyl estradiol (SPRINTEC 28) 0.25-35 MG-MCG tablet Take 1 tablet by mouth daily.   Nystatin POWD Apply liberally to affected area 2 times per day   spironolactone (ALDACTONE) 100 MG tablet Take 1 tablet (100 mg total) by mouth daily.   No facility-administered medications prior to visit.    ROS        Objective:     There were no vitals taken for this visit. {Vitals History (Optional):23777}  Physical Exam   No results found for any visits on 01/28/22. {Show previous labs (optional):23779}    Assessment & Plan:    Routine Health Maintenance and Physical Exam  Immunization History  Administered Date(s) Administered   DTaP 07/01/1999, 08/27/1999,  11/05/1999, 07/21/2000, 02/04/2004   Hepatitis A 12/01/2007, 12/06/2008   Hepatitis B 04/18/1999, 05/26/1999, 11/05/1999   HiB (PRP-OMP) 07/01/1999, 08/27/1999, 11/05/1999, 07/21/2000   IPV 07/01/1999, 08/27/1999, 04/25/2000, 02/04/2004   Influenza,inj,Quad PF,6+ Mos 03/08/2014   Influenza-Unspecified 06/07/2012   MMR 04/25/2001, 02/04/2004   Meningococcal Polysaccharide 12/06/2011   Pneumococcal Conjugate-13 07/21/2000   Pneumococcal-Unspecified 11/05/1999, 01/19/2000   Tdap 12/06/2011   Varicella 04/25/2000, 12/01/2007    Health Maintenance  Topic Date Due   HIV Screening  Never done   Hepatitis C Screening  Never done   INFLUENZA VACCINE  01/26/2022   PAP-Cervical Cytology Screening  01/28/2022 (Originally 04/16/2020)   PAP SMEAR-Modifier  01/28/2022 (Originally 04/16/2020)   TETANUS/TDAP  01/28/2022 (Originally 12/05/2021)   HPV VACCINES  Discontinued   COVID-19 Vaccine  Discontinued    Discussed health benefits of physical activity, and encouraged her to engage in regular exercise appropriate for her age and condition.  Problem List Items Addressed This Visit   None Visit Diagnoses     Annual physical exam    -  Primary   Cervical cancer screening       Need for Tdap vaccination          No follow-ups on file.     Baljit Liebert, NP   

## 2022-11-23 ENCOUNTER — Ambulatory Visit (INDEPENDENT_AMBULATORY_CARE_PROVIDER_SITE_OTHER): Payer: Managed Care, Other (non HMO) | Admitting: Medical-Surgical

## 2022-11-23 VITALS — BP 134/88 | HR 72 | Resp 20 | Ht 71.0 in | Wt 249.0 lb

## 2022-11-23 DIAGNOSIS — R0989 Other specified symptoms and signs involving the circulatory and respiratory systems: Secondary | ICD-10-CM | POA: Diagnosis not present

## 2022-11-23 MED ORDER — AMLODIPINE BESYLATE 2.5 MG PO TABS: 2.5000 mg | ORAL_TABLET | Freq: Every day | ORAL | 1 refills | Status: DC

## 2022-11-23 NOTE — Progress Notes (Signed)
        Established patient visit  History, exam, impression, and plan:  1. Labile hypertension Very pleasant 42 year old female presenting today with continued complaints of labile hypertension.  She was evaluated approximately 3 weeks ago with consistently elevated blood pressures.  At that time, she was taking Dyazide daily and on Toprol-XL 100 mg once daily.  She was instructed to increase Toprol-XL to 100 mg twice daily and continue Dyazide as prescribed.  Recommended return for nurse visit for blood pressure check in 2 weeks at that time.  She did not return but reports she did increase the medications as prescribed.  Does not have a blood pressure cuff at home but plans on getting 1 soon.  She went for a preoperative appointment for a hamstring repair and her blood pressure was in the 140s/100s.  Presents today with considerable concern that her blood pressure remains high despite medication changes and she has a surgery planned for Monday that may be canceled if she cannot get this under control.  Has been relaxing and working to maintain a stress-free environment.  Compliant with medications.  Following a low-sodium diet.  Denies CP, SOB, LE edema, HA, dizziness, vision changes, and palpitations.  On exam, HRRR, S1/S2 normal.  Lungs CTA.  Respirations even and unlabored.  No peripheral edema noted.  Blood pressure is still elevated on arrival today at 148/103.  Continue Dyazide and Toprol-XL.  Adding a small dose of amlodipine 2.5 mg daily as this was well-tolerated in the past.  Advised that this may not be a permanent addition but may be enough to get her blood pressure under control so that she can proceed with surgery.  Plan to return on Friday for nurse visit for blood pressure check as she does not have a way to monitor at home.  Procedures performed this visit: None.  Return in about 3 days (around 11/26/2022) for nurse visit for BP check.  __________________________________ Thayer Ohm, DNP, APRN, FNP-BC Primary Care and Sports Medicine [facility] [place]

## 2022-11-26 ENCOUNTER — Ambulatory Visit (INDEPENDENT_AMBULATORY_CARE_PROVIDER_SITE_OTHER): Payer: Managed Care, Other (non HMO) | Admitting: Medical-Surgical

## 2022-11-26 ENCOUNTER — Encounter: Payer: Self-pay | Admitting: Medical-Surgical

## 2022-11-26 VITALS — BP 143/93 | HR 64 | Resp 20 | Ht 71.0 in | Wt 247.7 lb

## 2022-11-26 DIAGNOSIS — R0989 Other specified symptoms and signs involving the circulatory and respiratory systems: Secondary | ICD-10-CM | POA: Diagnosis not present

## 2022-11-26 NOTE — Progress Notes (Signed)
        Established patient visit  History, exam, impression, and plan:  1. Labile hypertension Pleasant 42 year old female presenting for recheck of labile hypertension.  A few days ago, she was seen in office with continued elevation of blood pressure.  She is concerned that her surgical procedure planned for Monday may need to be rescheduled if unable to get her blood pressure to a normal range.  Has been taking Toprol-XL 100 mg twice daily and Dyazide 37.5-25 mg daily.  At our last visit, she was placed on amlodipine 2.5 mg daily.  She did pick this up from the pharmacy but admits that she did not start it until this morning.  Blood pressure remains elevated at 143/92 on arrival.  Recheck after sitting for 10 minutes at 143/93.  Recommend continuing Toprol-XL and Dyazide as prescribed.  Continue amlodipine 2.5 mg daily.  Monitor blood pressure at home with a goal of 130/80 or less.  Recommend updating her surgeons office regarding the addition of amlodipine and clarify requirements to be able to proceed with surgery.  Denies concerning symptoms today.     Procedures performed this visit: None.  Return in about 2 weeks (around 12/10/2022) for nurse visit for BP check.  __________________________________ Thayer Ohm, DNP, APRN, FNP-BC Primary Care and Sports Medicine [facility] [place]

## 2022-11-29 HISTORY — PX: OTHER SURGICAL HISTORY: SHX169

## 2022-12-01 ENCOUNTER — Telehealth: Payer: Self-pay

## 2022-12-01 NOTE — Transitions of Care (Post Inpatient/ED Visit) (Signed)
12/01/2022  Name: Jean Yu MRN: [id number] DOB: March 30, 1981  Today's TOC FU Call Status: Today's TOC FU Call Status:: Successful TOC FU Call Competed TOC FU Call Complete Date: 12/01/22  Transition Care Management Follow-up Telephone Call Date of Discharge: 11/30/22 Discharge Facility: Other ([facility]) Name of Other ([facility]) Discharge Facility: [facility] Type of Discharge: Inpatient Admission Primary Inpatient Discharge Diagnosis:: muscle tear How have you been since you were released from the hospital?: Better Any questions or concerns?: No  Items Reviewed: Did you receive and understand the discharge instructions provided?: No Medications obtained,verified, and reconciled?: Yes (Medications Reviewed) Any new allergies since your discharge?: No Dietary orders reviewed?: Yes Do you have support at home?: Yes People in Home: child(ren), adult  Medications Reviewed Today: Medications Reviewed Today     Reviewed by Karena Addison, LPN (Licensed Practical Nurse) on 12/01/22 at [phone]  Med List Status: <None>   Medication Order Taking? Sig Documenting Provider Last Dose Status Informant  amLODipine (NORVASC) 2.5 MG tablet [id number] Yes Take 1 tablet (2.5 mg total) by mouth daily. Christen Butter, NP Taking Active   Ascorbic Acid (VITAMIN C) 1000 MG tablet [id number] Yes Take 1,000 mg by mouth daily. [provider] Taking Active Self  BLACK ELDERBERRY PO [id number] Yes Take by mouth. [provider] Taking Active   cholecalciferol (VITAMIN D) 1000 units tablet [id number] Yes Take 1,000 Units by mouth daily. [provider] Taking Active   cloNIDine (CATAPRES) 0.1 MG tablet [id number] Yes Take one tablet as needed up to twice a day when BP is over 160/90. [name], PA-C Taking Active   diclofenac Sodium (VOLTAREN) 1 % GEL [id number] Yes Apply 4 g topically 4 (four) times daily. To affected joint. [name], PA-C Taking Active    FENUGREEK PO [id number] Yes Take by mouth. [provider] Taking Active   furosemide (LASIX) 20 MG tablet [id number] Yes Take one tablet as needed for left leg swelling Breeback, [name] L, PA-C Taking Active   metoprolol succinate (TOPROL-XL) 100 MG 24 hr tablet [id number] Yes TAKE 1 TABLET BY MOUTH TWICE DAILY. Christen Butter, NP Taking Active   triamterene-hydrochlorothiazide (DYAZIDE) 37.5-25 MG capsule [id number] Yes TAKE 1 CAPSULE BY MOUTH ONCE DAILY . APPOINTMENT REQUIRED FOR FUTURE REFILLS Nolene Ebbs Taking Active   Turmeric 500 MG CAPS [id number] Yes Take 500 mg by mouth in the morning and at bedtime. [provider] Taking Active             Home Care and Equipment/Supplies: Were Home Health Services Ordered?: Yes Name of Home Health Agency:: unknown Has Agency set up a time to come to your home?: No Any new equipment or medical supplies ordered?: Yes Name of Medical supply agency?: unknown Were you able to get the equipment/medical supplies?: Yes Do you have any questions related to the use of the equipment/supplies?: No  Functional Questionnaire: Do you need assistance with bathing/showering or dressing?: Yes Do you need assistance with meal preparation?: No Do you need assistance with eating?: No Do you have difficulty maintaining continence: No Do you need assistance with getting out of bed/getting out of a chair/moving?: No Do you have difficulty managing or taking your medications?: No  Follow up appointments reviewed: PCP Follow-up appointment confirmed?: NA Specialist Hospital Follow-up appointment confirmed?: Yes Date of Specialist follow-up appointment?: 12/21/22 Follow-Up Specialty Provider:: surgeon Do you need transportation to your follow-up appointment?: No Do you understand care options if your condition(s) worsen?: Yes-patient verbalized understanding  SIGNATURE Karena Addison, LPN [facility] Nurse Health Advisor Direct Dial  [phone]

## 2022-12-02 ENCOUNTER — Other Ambulatory Visit: Payer: Self-pay | Admitting: Physician Assistant

## 2022-12-02 DIAGNOSIS — I10 Essential (primary) hypertension: Secondary | ICD-10-CM

## 2022-12-10 ENCOUNTER — Ambulatory Visit: Payer: Managed Care, Other (non HMO) | Admitting: Medical-Surgical

## 2022-12-15 ENCOUNTER — Ambulatory Visit: Payer: Managed Care, Other (non HMO) | Admitting: Medical-Surgical

## 2022-12-15 ENCOUNTER — Ambulatory Visit: Payer: Managed Care, Other (non HMO)

## 2022-12-17 ENCOUNTER — Ambulatory Visit: Payer: Managed Care, Other (non HMO)

## 2022-12-21 ENCOUNTER — Ambulatory Visit: Payer: Managed Care, Other (non HMO)

## 2022-12-21 ENCOUNTER — Encounter: Payer: Self-pay | Admitting: Medical-Surgical

## 2023-01-05 ENCOUNTER — Other Ambulatory Visit: Payer: Self-pay | Admitting: Physician Assistant

## 2023-01-05 DIAGNOSIS — I10 Essential (primary) hypertension: Secondary | ICD-10-CM

## 2023-01-07 NOTE — Telephone Encounter (Signed)
Called spoke with patient she is scheduled for a follow up appointment on 01-18-23

## 2023-01-18 ENCOUNTER — Encounter: Payer: Self-pay | Admitting: Physician Assistant

## 2023-01-18 ENCOUNTER — Ambulatory Visit: Payer: Managed Care, Other (non HMO) | Admitting: Physician Assistant

## 2023-01-18 VITALS — BP 137/95 | HR 72 | Ht 71.0 in | Wt 251.8 lb

## 2023-01-18 DIAGNOSIS — R0989 Other specified symptoms and signs involving the circulatory and respiratory systems: Secondary | ICD-10-CM

## 2023-01-18 DIAGNOSIS — R5383 Other fatigue: Secondary | ICD-10-CM

## 2023-01-18 DIAGNOSIS — G35 Multiple sclerosis: Secondary | ICD-10-CM | POA: Diagnosis not present

## 2023-01-18 DIAGNOSIS — Z79899 Other long term (current) drug therapy: Secondary | ICD-10-CM

## 2023-01-18 DIAGNOSIS — D508 Other iron deficiency anemias: Secondary | ICD-10-CM | POA: Diagnosis not present

## 2023-01-18 DIAGNOSIS — E538 Deficiency of other specified B group vitamins: Secondary | ICD-10-CM

## 2023-01-18 DIAGNOSIS — I10 Essential (primary) hypertension: Secondary | ICD-10-CM

## 2023-01-18 MED ORDER — TRIAMTERENE-HCTZ 37.5-25 MG PO CAPS
1.0000 | ORAL_CAPSULE | Freq: Every day | ORAL | 1 refills | Status: DC
Start: 2023-01-18 — End: 2023-04-25

## 2023-01-18 MED ORDER — AMLODIPINE BESYLATE 5 MG PO TABS: 5.0000 mg | ORAL_TABLET | Freq: Every day | ORAL | 0 refills | Status: DC

## 2023-01-18 NOTE — Progress Notes (Unsigned)
   Established Patient Office Visit  Subjective   Patient ID: Jean Yu, female    DOB: 1981/05/26  Age: 42 y.o. MRN: [id number]  Chief Complaint  Patient presents with   Hypertension    Pt is here for a follow up for hypertension. States she recently had surgery June 2024 and has concerns about recent bloodwork.    HPI Right hamstring repair June 3rd  {History (Optional):23778}  ROS    Objective:     BP (!) 120/95 (BP Location: Left Arm, Patient Position: Sitting, Cuff Size: Large)   Pulse 72   Ht 5\' 11"  (1.803 m)   Wt 251 lb 12 oz (114.2 kg)   LMP 12/06/2022 (Approximate)   SpO2 95%   BMI 35.11 kg/m  {Vitals History (Optional):23777}  Physical Exam   No results found for any visits on 01/18/23.  {Labs (Optional):23779}  The 10-year ASCVD risk score (Arnett DK, et al., 2019) is: 0.6%    Assessment & Plan:   Problem List Items Addressed This Visit   None   No follow-ups on file.    Tandy Gaw, PA-C

## 2023-01-19 ENCOUNTER — Encounter: Payer: Self-pay | Admitting: Physician Assistant

## 2023-01-19 LAB — CBC WITH DIFFERENTIAL/PLATELET
Basophils Absolute: 0 10*3/uL (ref 0.0–0.2)
Basos: 1 %
EOS (ABSOLUTE): 0.1 10*3/uL (ref 0.0–0.4)
Eos: 2 %
Hematocrit: 37.7 % (ref 34.0–46.6)
Hemoglobin: 12.4 g/dL (ref 11.1–15.9)
Immature Grans (Abs): 0 10*3/uL (ref 0.0–0.1)
Immature Granulocytes: 0 %
Lymphocytes Absolute: 1.3 10*3/uL (ref 0.7–3.1)
Lymphs: 33 %
MCH: 28.6 pg (ref 26.6–33.0)
MCHC: 32.9 g/dL (ref 31.5–35.7)
MCV: 87 fL (ref 79–97)
Monocytes Absolute: 0.4 10*3/uL (ref 0.1–0.9)
Monocytes: 9 %
Neutrophils Absolute: 2.2 10*3/uL (ref 1.4–7.0)
Neutrophils: 55 %
Platelets: 184 10*3/uL (ref 150–450)
RBC: 4.33 x10E6/uL (ref 3.77–5.28)
RDW: 12 % (ref 11.7–15.4)
WBC: 3.9 10*3/uL (ref 3.4–10.8)

## 2023-01-19 LAB — CMP14+EGFR
ALT: 10 IU/L (ref 0–32)
AST: 16 IU/L (ref 0–40)
Albumin: 4.4 g/dL (ref 3.9–4.9)
Alkaline Phosphatase: 57 IU/L (ref 44–121)
BUN/Creatinine Ratio: 14 (ref 9–23)
BUN: 12 mg/dL (ref 6–24)
Bilirubin Total: 0.5 mg/dL (ref 0.0–1.2)
CO2: 21 mmol/L (ref 20–29)
Calcium: 9.4 mg/dL (ref 8.7–10.2)
Chloride: 100 mmol/L (ref 96–106)
Creatinine, Ser: 0.83 mg/dL (ref 0.57–1.00)
Globulin, Total: 3 g/dL (ref 1.5–4.5)
Glucose: 78 mg/dL (ref 70–99)
Potassium: 3.9 mmol/L (ref 3.5–5.2)
Sodium: 135 mmol/L (ref 134–144)
Total Protein: 7.4 g/dL (ref 6.0–8.5)
eGFR: 90 mL/min/{1.73_m2} (ref >59)

## 2023-01-19 LAB — THYROID PANEL WITH TSH
Free Thyroxine Index: 2.4 (ref 1.2–4.9)
T3 Uptake Ratio: 29 % (ref 24–39)
T4, Total: 8.2 ug/dL (ref 4.5–12.0)
TSH: 0.807 u[IU]/mL (ref 0.450–4.500)

## 2023-01-19 LAB — IRON,TIBC AND FERRITIN PANEL
Ferritin: 126 ng/mL (ref 15–150)
Iron Saturation: 24 % (ref 15–55)
Iron: 80 ug/dL (ref 27–159)
Total Iron Binding Capacity: 339 ug/dL (ref 250–450)
UIBC: 259 ug/dL (ref 131–425)

## 2023-01-19 LAB — B12 AND FOLATE PANEL
Folate: 8.9 ng/mL (ref >3.0)
Vitamin B-12: 948 pg/mL (ref 232–1245)

## 2023-01-19 LAB — VITAMIN D 25 HYDROXY (VIT D DEFICIENCY, FRACTURES): Vit D, 25-Hydroxy: 51.9 ng/mL (ref 30.0–100.0)

## 2023-01-19 NOTE — Progress Notes (Signed)
Sheka,   B12 looks great.  Vitamin D looks great.  Kidney, liver, glucose looks great.  Hemoglobin improved.  Iron looks good. No need to supplement.   Labs look great.

## 2023-02-01 ENCOUNTER — Ambulatory Visit: Payer: Managed Care, Other (non HMO)

## 2023-02-14 ENCOUNTER — Other Ambulatory Visit: Payer: Self-pay | Admitting: Physician Assistant

## 2023-02-14 DIAGNOSIS — R0989 Other specified symptoms and signs involving the circulatory and respiratory systems: Secondary | ICD-10-CM

## 2023-03-23 ENCOUNTER — Other Ambulatory Visit: Payer: Self-pay | Admitting: Physician Assistant

## 2023-03-23 DIAGNOSIS — Z1231 Encounter for screening mammogram for malignant neoplasm of breast: Secondary | ICD-10-CM

## 2023-03-24 DIAGNOSIS — Z1231 Encounter for screening mammogram for malignant neoplasm of breast: Secondary | ICD-10-CM

## 2023-04-06 ENCOUNTER — Ambulatory Visit: Payer: Managed Care, Other (non HMO)

## 2023-04-06 DIAGNOSIS — Z1231 Encounter for screening mammogram for malignant neoplasm of breast: Secondary | ICD-10-CM

## 2023-04-10 NOTE — Progress Notes (Signed)
Normal mammogram. Follow up in one year.

## 2023-04-18 ENCOUNTER — Ambulatory Visit: Payer: Managed Care, Other (non HMO) | Admitting: Physician Assistant

## 2023-04-25 ENCOUNTER — Ambulatory Visit (INDEPENDENT_AMBULATORY_CARE_PROVIDER_SITE_OTHER): Payer: Managed Care, Other (non HMO) | Admitting: Physician Assistant

## 2023-04-25 ENCOUNTER — Encounter: Payer: Self-pay | Admitting: Physician Assistant

## 2023-04-25 VITALS — BP 118/86 | HR 80 | Ht 71.5 in | Wt 256.2 lb

## 2023-04-25 DIAGNOSIS — R195 Other fecal abnormalities: Secondary | ICD-10-CM | POA: Diagnosis not present

## 2023-04-25 DIAGNOSIS — S76311S Strain of muscle, fascia and tendon of the posterior muscle group at thigh level, right thigh, sequela: Secondary | ICD-10-CM

## 2023-04-25 DIAGNOSIS — I10 Essential (primary) hypertension: Secondary | ICD-10-CM

## 2023-04-25 DIAGNOSIS — Z23 Encounter for immunization: Secondary | ICD-10-CM

## 2023-04-25 DIAGNOSIS — R0989 Other specified symptoms and signs involving the circulatory and respiratory systems: Secondary | ICD-10-CM | POA: Diagnosis not present

## 2023-04-25 MED ORDER — METOPROLOL SUCCINATE ER 100 MG PO TB24
ORAL_TABLET | ORAL | 1 refills | Status: DC
Start: 2023-04-25 — End: 2023-08-31

## 2023-04-25 MED ORDER — DICLOFENAC SODIUM 1 % EX GEL
4.0000 g | Freq: Four times a day (QID) | CUTANEOUS | 2 refills | Status: AC
Start: 2023-04-25 — End: ?

## 2023-04-25 MED ORDER — AMLODIPINE BESYLATE 5 MG PO TABS: 5.0000 mg | ORAL_TABLET | Freq: Every day | ORAL | 1 refills | Status: DC

## 2023-04-25 MED ORDER — TRIAMTERENE-HCTZ 37.5-25 MG PO CAPS: 1.0000 | ORAL_CAPSULE | Freq: Every day | ORAL | 1 refills | Status: DC

## 2023-04-25 NOTE — Progress Notes (Unsigned)
Established Patient Office Visit  Subjective   Patient ID: Jean Yu, female    DOB: [birth year]  Age: 42 y.o. MRN: [id number]  Chief Complaint  Patient presents with   Hypertension   mucus in stool    X years off and on - worse in last few months.     HPI Pt is a 42 yo female with HTN who presents to the clinic for follow up and medication refills.   She is checking BP at home and running 120s to 130s over 70-80s. She denies any palpitations, headaches, vision changes, SOB, CP. She is taking her medication daily but did forget it this morning before visit. She denies any more syncope episodes.   She has noticed a little more mucus in stool. Denies any blood, constipation, diarrhea. She has been using liquid chloraphyll and collagen daily. No abdominal pain or other GI concerns.   Pt is using voltaren gel over calf where she had tendon ruputure. She needs refills.   .. Active Ambulatory Problems    Diagnosis Date Noted   Primary hypertension 12/18/2008   MS (multiple sclerosis) (HCC) 12/09/2005   BMI 33.0-33.9,adult 03/29/2017   Obstructive sleep apnea 05/09/2017   Family history of sudden cardiac death in mother 05/09/2017   Chest pain with low risk of acute coronary syndrome May 09, 2017   Large breasts 08/29/2017   Class 1 obesity due to excess calories without serious comorbidity with body mass index (BMI) of 34.0 to 34.9 in adult 08/29/2017   Left arm pain 05/11/2019   Palpitations 05/11/2019   Breast skin changes 05/11/2019   Breast tenderness in female 05/11/2019   Osteoarthritis of left hip 01/09/2020   Acute reaction to stress 02/20/2020   Carpal tunnel syndrome 09/18/2015   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   Circadian rhythm sleep disorder, 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   Degenerative disc disease, lumbar 08/23/2019   Fatigue 09/18/2015   High risk medication use 11/16/2017   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   Left leg pain 02/17/2018   Memory loss 09/18/2015   Numbness and tingling in both hands 09/18/2015   Oligomenorrhea 04/29/2014   Premature ventricular contractions (PVCs) (VPCs) 02/20/2020   Unsteady gait 09/18/2015   Vitamin D insufficiency 01/01/2013   Acute intractable headache 02/27/2020   Primary osteoarthritis of left hip 03/28/2020   Hypotension due to drugs 07/10/2020   Anemia 07/15/2020   Hypokalemia 07/15/2020   Right-sided chest pain 07/15/2020   Burping 07/15/2020   Indigestion 07/15/2020   Left hip pain 09/16/2020   Hemorrhoids 11/20/2020   Choking 11/20/2020   Sinus drainage 11/20/2020   Bunion of great toe of left foot 07/21/2021   Bunion of great toe of right foot 07/21/2021   Left-sided chest pain 07/21/2021   Gastroesophageal reflux disease without esophagitis 07/21/2021   Brain fog 02/01/2022   Acute otitis externa of right ear 02/01/2022   Labile hypertension 02/17/2022   Right proximal hamstring tendon rupture, sequela 02/19/2022   OSA on CPAP 02/19/2022   Localized swelling of left lower leg 03/24/2022   Partial tear of right hamstring 10/18/2022   No energy 01/18/2023   Low serum vitamin B12 01/18/2023   Essential hypertension 01/18/2023   Mucus in stool 04/26/2023   Resolved Ambulatory Problems    Diagnosis Date Noted   PHLEBITIS&THROMBOPHLEB SUP VESSELS LOWER EXTREM 12/18/2008   CRAMP IN LIMB 12/18/2008   EDEMA LEG 12/18/2008   Bright red blood per  rectum 11/20/2020   Past Medical History:  Diagnosis Date   Borderline high cholesterol    Hypertension    Obesity    OSA (obstructive sleep apnea)      ROS See HPI.    Objective:     BP 118/86   Pulse 80   Ht 5' 11.5" (1.816 m)   Wt 256 lb 4 oz (116.2 kg)   LMP 03/16/2023   SpO2 99%   BMI 35.24 kg/m  BP Readings from Last 3 Encounters:  04/25/23 118/86  01/18/23 (!) 137/95  11/26/22 (!) 143/93   Wt Readings from Last 3 Encounters:  04/25/23 256 lb 4 oz (116.2 kg)   01/18/23 251 lb 12 oz (114.2 kg)  11/26/22 247 lb 11.2 oz (112.4 kg)      Physical Exam Constitutional:      Appearance: Normal appearance. She is obese.  HENT:     Head: Normocephalic.  Cardiovascular:     Rate and Rhythm: Normal rate and regular rhythm.     Heart sounds: Normal heart sounds.  Pulmonary:     Effort: Pulmonary effort is normal.     Breath sounds: Normal breath sounds.  Abdominal:     General: There is no distension.     Palpations: Abdomen is soft. There is no mass.     Tenderness: There is no abdominal tenderness. There is no right CVA tenderness, left CVA tenderness, guarding or rebound.     Hernia: No hernia is present.  Musculoskeletal:     Right lower leg: No edema.     Left lower leg: No edema.  Neurological:     General: No focal deficit present.     Mental Status: She is alert and oriented to person, place, and time.  Psychiatric:        Mood and Affect: Mood normal.      The 10-year ASCVD risk score (Arnett DK, et al., 2019) is: 0.5%    Assessment & Plan:  Jean Yu KitchenMarland KitchenLoni Yu" was seen today for hypertension and mucus in stool.  Diagnoses and all orders for this visit:  Essential hypertension -     amLODipine (NORVASC) 5 MG tablet; Take 1 tablet (5 mg total) by mouth daily. -     triamterene-hydrochlorothiazide (DYAZIDE) 37.5-25 MG capsule; Take 1 each (1 capsule total) by mouth daily.  Labile hypertension -     metoprolol succinate (TOPROL-XL) 100 MG 24 hr tablet; TAKE 1 TABLET BY MOUTH TWICE DAILY.  Encounter for immunization -     Flu vaccine trivalent PF, 6mos and older(Flulaval,Afluria,Fluarix,Fluzone)  Mucus in stool  Right proximal hamstring tendon rupture, sequela -     diclofenac Sodium (VOLTAREN) 1 % GEL; Apply 4 g topically 4 (four) times daily. To affected joint.   BP looks good.  Continue amlodipine and Dyazide and metoprolol.   No concerns with mucus in stool with no blood/pain/constipation/diarrhea. Likely due to the  supplements she is using.   Refilled voltaren gel to use as needed.   Flu shot given today with no complications.    Jean Gaw, PA-C

## 2023-04-26 ENCOUNTER — Encounter: Payer: Self-pay | Admitting: Physician Assistant

## 2023-04-26 DIAGNOSIS — R195 Other fecal abnormalities: Secondary | ICD-10-CM | POA: Insufficient documentation

## 2023-08-31 ENCOUNTER — Ambulatory Visit (INDEPENDENT_AMBULATORY_CARE_PROVIDER_SITE_OTHER): Admitting: Physician Assistant

## 2023-08-31 ENCOUNTER — Encounter: Payer: Self-pay | Admitting: Physician Assistant

## 2023-08-31 VITALS — BP 135/82 | HR 73 | Ht 71.5 in | Wt 258.8 lb

## 2023-08-31 DIAGNOSIS — Z131 Encounter for screening for diabetes mellitus: Secondary | ICD-10-CM | POA: Diagnosis not present

## 2023-08-31 DIAGNOSIS — G4733 Obstructive sleep apnea (adult) (pediatric): Secondary | ICD-10-CM

## 2023-08-31 DIAGNOSIS — R195 Other fecal abnormalities: Secondary | ICD-10-CM

## 2023-08-31 DIAGNOSIS — R0989 Other specified symptoms and signs involving the circulatory and respiratory systems: Secondary | ICD-10-CM | POA: Diagnosis not present

## 2023-08-31 DIAGNOSIS — I10 Essential (primary) hypertension: Secondary | ICD-10-CM | POA: Diagnosis not present

## 2023-08-31 DIAGNOSIS — Z13220 Encounter for screening for lipoid disorders: Secondary | ICD-10-CM

## 2023-08-31 DIAGNOSIS — E559 Vitamin D deficiency, unspecified: Secondary | ICD-10-CM

## 2023-08-31 DIAGNOSIS — Z0000 Encounter for general adult medical examination without abnormal findings: Secondary | ICD-10-CM | POA: Diagnosis not present

## 2023-08-31 MED ORDER — METOPROLOL SUCCINATE ER 100 MG PO TB24
ORAL_TABLET | ORAL | 1 refills | Status: DC
Start: 2023-08-31 — End: 2023-10-24

## 2023-08-31 MED ORDER — TRIAMTERENE-HCTZ 37.5-25 MG PO CAPS: 1.0000 | ORAL_CAPSULE | Freq: Every day | ORAL | 1 refills | Status: DC

## 2023-08-31 MED ORDER — AMLODIPINE BESYLATE 5 MG PO TABS: 5.0000 mg | ORAL_TABLET | Freq: Every day | ORAL | 1 refills | Status: DC

## 2023-08-31 NOTE — Progress Notes (Signed)
 Complete physical exam  Patient: Jean Yu   DOB: 1981/06/06   42 y.o. Female  MRN: [id number]  Subjective:    CC: annual physical  Jean Yu is a 43 y.o. female who presents today for a complete physical exam. She reports consuming a general diet. The patient does not participate in regular exercise at present. She generally feels well. She reports sleeping fairly well. She does have additional problems to discuss today.   Her blood pressure has been stable and well controlled on her current medications. She never has to use lasix or clonidine.  She denies chest pains, shortness of breath, headaches, or palpitations.   She needs a new CPAP machine. She uses it nightly. She has had for over 7 years. Not sure of her CPAP supplies company.   She has also noticed some mucus in stool from time to time. She has not had colonoscopy. She denies any melena or hematochezia. She denies any abdominal pain.   Most recent fall risk assessment:    09/04/2023    9:06 AM  Fall Risk   Falls in the past year? 0  Number falls in past yr: 0  Injury with Fall? 0  Risk for fall due to : No Fall Risks     Most recent depression screenings:    09/04/2023    9:07 AM 01/18/2023   10:27 AM  PHQ 2/9 Scores  PHQ - 2 Score 0 0    Vision:Within last year and Dental: No current dental problems and Receives regular dental care  Patient Active Problem List   Diagnosis Date Noted   Mucus in stool 04/26/2023   No energy 01/18/2023   Low serum vitamin B12 01/18/2023   Essential hypertension 01/18/2023   Partial tear of right hamstring 10/18/2022   Localized swelling of left lower leg 03/24/2022   Right proximal hamstring tendon rupture, sequela 02/19/2022   OSA on CPAP 02/19/2022   Labile hypertension 02/17/2022   Brain fog 02/01/2022   Acute otitis externa of right ear 02/01/2022   Bunion of great toe of left foot 07/21/2021   Bunion of great toe of right foot 07/21/2021    Left-sided chest pain 07/21/2021   Gastroesophageal reflux disease without esophagitis 07/21/2021   Hemorrhoids 11/20/2020   Choking 11/20/2020   Sinus drainage 11/20/2020   Left hip pain 09/16/2020   Anemia 07/15/2020   Hypokalemia 07/15/2020   Right-sided chest pain 07/15/2020   Burping 07/15/2020   Indigestion 07/15/2020   Hypotension due to drugs 07/10/2020   Primary osteoarthritis of left hip 03/28/2020   Acute intractable headache 02/27/2020   Acute reaction to stress 02/20/2020   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   Premature ventricular contractions (PVCs) (VPCs) 02/20/2020   Osteoarthritis of left hip 01/09/2020   Degenerative disc disease, lumbar 08/23/2019   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   Left arm pain 05/11/2019   Palpitations 05/11/2019   Breast skin changes 05/11/2019   Breast tenderness in female 05/11/2019   Left leg pain 02/17/2018   High risk medication use 11/16/2017   Large breasts 08/29/2017   Class 1 obesity due to excess calories without serious comorbidity with body mass index (BMI) of 34.0 to 34.9 in adult 08/29/2017   Obstructive sleep apnea 05-20-2017   Family history of sudden cardiac death in mother 2017/05/20   Chest pain with low risk of acute coronary syndrome May 20, 2017   BMI 33.0-33.9,adult 03/29/2017   Carpal  tunnel syndrome 09/18/2015   Circadian rhythm sleep disorder, 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   Fatigue 09/18/2015   Memory loss 09/18/2015   Numbness and tingling in both hands 09/18/2015   Unsteady gait 09/18/2015   Oligomenorrhea 04/29/2014   Vitamin D insufficiency 01/01/2013   Primary hypertension 12/18/2008   MS (multiple sclerosis) (HCC) 12/09/2005   Past Medical History:  Diagnosis Date   Borderline high cholesterol    Hypertension    MS (multiple sclerosis) (HCC) 12/09/2005   Obesity    OSA (obstructive sleep apnea)    Past Surgical History:  Procedure Laterality  Date   No prior surgery     right hamstring repair  11/29/2022   Social History   Tobacco Use   Smoking status: Former   Smokeless tobacco: Never  Vaping Use   Vaping status: Never Used  Substance Use Topics   Alcohol use: Yes    Alcohol/week: 0.0 standard drinks of alcohol    Comment: Rarely   Drug use: No   Family History  Problem Relation Age of Onset   Hypertension Mother    Heart attack Mother    Cancer Maternal Aunt    Allergies  Allergen Reactions   Celebrex [Celecoxib]     GI upset and darker stools   Hydralazine     Hypotension and syncope      Patient Care Team: Nolene Ebbs as PCP - General (Family Medicine)   Outpatient Medications Prior to Visit  Medication Sig   Ascorbic Acid (VITAMIN C) 1000 MG tablet Take 1,000 mg by mouth daily.   Ashwagandha (ASHWAGANDHA 35) 120 MG CAPS    cholecalciferol (VITAMIN D) 1000 units tablet Take 1,000 Units by mouth daily.   cloNIDine (CATAPRES) 0.1 MG tablet Take one tablet as needed up to twice a day when BP is over 160/90.   cyanocobalamin 1000 MCG tablet Take 1,000 mcg by mouth daily.   diclofenac Sodium (VOLTAREN) 1 % GEL Apply 4 g topically 4 (four) times daily. To affected joint.   FENUGREEK PO Take by mouth.   furosemide (LASIX) 20 MG tablet Take one tablet as needed for left leg swelling   Turmeric 500 MG CAPS Take 500 mg by mouth in the morning and at bedtime.   [DISCONTINUED] amLODipine (NORVASC) 5 MG tablet Take 1 tablet (5 mg total) by mouth daily.   [DISCONTINUED] metoprolol succinate (TOPROL-XL) 100 MG 24 hr tablet TAKE 1 TABLET BY MOUTH TWICE DAILY.   [DISCONTINUED] triamterene-hydrochlorothiazide (DYAZIDE) 37.5-25 MG capsule Take 1 each (1 capsule total) by mouth daily.   No facility-administered medications prior to visit.    Review of Systems  Gastrointestinal:  Negative for blood in stool and melena.       Mucus in stool   All other systems reviewed and are negative.  Objective:    BP Readings from Last 3 Encounters:  08/31/23 135/82  04/25/23 118/86  01/18/23 (!) 137/95   Wt Readings from Last 3 Encounters:  08/31/23 258 lb 12 oz (117.4 kg)  04/25/23 256 lb 4 oz (116.2 kg)  01/18/23 251 lb 12 oz (114.2 kg)   SpO2 Readings from Last 3 Encounters:  08/31/23 97%  04/25/23 99%  01/18/23 95%   Physical Exam Constitutional:      Appearance: Normal appearance. She is obese.  HENT:     Head: Normocephalic and atraumatic.     Right Ear: Tympanic membrane, ear canal and external ear normal. There is no impacted cerumen.  Left Ear: Tympanic membrane, ear canal and external ear normal. There is no impacted cerumen.     Nose: Nose normal.     Mouth/Throat:     Mouth: Mucous membranes are moist.  Eyes:     Extraocular Movements: Extraocular movements intact.     Conjunctiva/sclera: Conjunctivae normal.     Pupils: Pupils are equal, round, and reactive to light.  Neck:     Vascular: No carotid bruit.  Cardiovascular:     Rate and Rhythm: Normal rate and regular rhythm.     Pulses: Normal pulses.     Heart sounds: Normal heart sounds.  Pulmonary:     Effort: Pulmonary effort is normal.     Breath sounds: Normal breath sounds.  Abdominal:     General: There is no distension.     Palpations: Abdomen is soft. There is no mass.     Tenderness: There is no abdominal tenderness. There is no right CVA tenderness, left CVA tenderness, guarding or rebound.  Musculoskeletal:        General: Normal range of motion.     Cervical back: Normal range of motion and neck supple. No tenderness.     Right lower leg: No edema.     Left lower leg: No edema.  Lymphadenopathy:     Cervical: No cervical adenopathy.  Skin:    General: Skin is warm.  Neurological:     General: No focal deficit present.     Mental Status: She is alert and oriented to person, place, and time.  Psychiatric:        Mood and Affect: Mood normal.        Behavior: Behavior normal.      Assessment & Plan:    Routine Health Maintenance and Physical Exam  Immunization History  Administered Date(s) Administered   Influenza Split 05/28/2011, 05/29/2015   Influenza, Seasonal, Injecte, Preservative Fre 04/11/2014, 06/01/2016, 04/25/2023   Influenza,inj,Quad PF,6+ Mos 03/29/2017, 04/24/2018, 04/29/2021, 02/17/2022   Influenza-Unspecified 03/19/2013, 03/06/2019   Moderna SARS-COV2 Booster Vaccination 05/12/2020, 11/26/2020   Moderna Sars-Covid-2 Vaccination 07/31/2019, 08/28/2019, 05/12/2020, 11/26/2020   Td 06/28/2004   Tdap 06/28/2004, 11/01/2014    Health Maintenance  Topic Date Due   HIV Screening  Never done   COVID-19 Vaccine (3 - Moderna risk series) 09/16/2023 (Originally 12/24/2020)   MAMMOGRAM  04/05/2024   DTaP/Tdap/Td (4 - Td or Tdap) 10/31/2024   Cervical Cancer Screening (HPV/Pap Cotest)  04/29/2026   INFLUENZA VACCINE  Completed   Hepatitis C Screening  Completed   HPV VACCINES  Aged Out    Discussed health benefits of physical activity, and encouraged her to engage in regular exercise appropriate for her age and condition. Marland KitchenWannetta Sender "Andreas Ohm" was seen today for annual exam.  Diagnoses and all orders for this visit:  Routine adult health maintenance -     Lipid panel -     CMP14+EGFR -     TSH -     CBC w/Diff/Platelet -     VITAMIN D 25 Hydroxy (Vit-D Deficiency, Fractures)  Labile hypertension -     metoprolol succinate (TOPROL-XL) 100 MG 24 hr tablet; TAKE 1 TABLET BY MOUTH TWICE DAILY.  Essential hypertension -     triamterene-hydrochlorothiazide (DYAZIDE) 37.5-25 MG capsule; Take 1 each (1 capsule total) by mouth daily. -     amLODipine (NORVASC) 5 MG tablet; Take 1 tablet (5 mg total) by mouth daily. -     CMP14+EGFR  Screening for diabetes mellitus -  CMP14+EGFR  Vitamin D deficiency -     VITAMIN D 25 Hydroxy (Vit-D Deficiency, Fractures)  Screening for lipid disorders -     Lipid panel  Mucus in stool  OSA on CPAP    -  Patient declines COVID vaccine today - Medications refilled today - Fasting labs ordered today - CPAP machine - sent to referral specialist to see if replacement is covered by insurance.   - [facility] [place] Sleep study done 04/27/2017. - Emphasized importance of exercise 150 mins/week with vitamin D and calcium daily.  -pap and mammogram UTD - no red flags with mucus in stool watch and see if triggered by diet, if continues or worsens follow up for work up  Follow up in 6 months for BP.     Tandy Gaw, PA-C

## 2023-08-31 NOTE — Patient Instructions (Signed)

## 2023-09-03 LAB — LIPID PANEL
Chol/HDL Ratio: 2.5 ratio (ref 0.0–4.4)
Cholesterol, Total: 221 mg/dL — ABNORMAL HIGH (ref 100–199)
HDL: 89 mg/dL (ref >39)
LDL Chol Calc (NIH): 122 mg/dL — ABNORMAL HIGH (ref 0–99)
Triglycerides: 60 mg/dL (ref 0–149)
VLDL Cholesterol Cal: 10 mg/dL (ref 5–40)

## 2023-09-03 LAB — CMP14+EGFR
ALT: 16 IU/L (ref 0–32)
AST: 17 IU/L (ref 0–40)
Albumin: 4.5 g/dL (ref 3.9–4.9)
Alkaline Phosphatase: 61 IU/L (ref 44–121)
BUN/Creatinine Ratio: 18 (ref 9–23)
BUN: 14 mg/dL (ref 6–24)
Bilirubin Total: 0.5 mg/dL (ref 0.0–1.2)
CO2: 24 mmol/L (ref 20–29)
Calcium: 9.6 mg/dL (ref 8.7–10.2)
Chloride: 102 mmol/L (ref 96–106)
Creatinine, Ser: 0.77 mg/dL (ref 0.57–1.00)
Globulin, Total: 2.9 g/dL (ref 1.5–4.5)
Glucose: 81 mg/dL (ref 70–99)
Potassium: 3.9 mmol/L (ref 3.5–5.2)
Sodium: 140 mmol/L (ref 134–144)
Total Protein: 7.4 g/dL (ref 6.0–8.5)
eGFR: 99 mL/min/{1.73_m2} (ref >59)

## 2023-09-03 LAB — CBC WITH DIFFERENTIAL/PLATELET
Basophils Absolute: 0 10*3/uL (ref 0.0–0.2)
Basos: 1 %
EOS (ABSOLUTE): 0.1 10*3/uL (ref 0.0–0.4)
Eos: 1 %
Hematocrit: 37.3 % (ref 34.0–46.6)
Hemoglobin: 12 g/dL (ref 11.1–15.9)
Immature Grans (Abs): 0 10*3/uL (ref 0.0–0.1)
Immature Granulocytes: 0 %
Lymphocytes Absolute: 1.7 10*3/uL (ref 0.7–3.1)
Lymphs: 39 %
MCH: 28 pg (ref 26.6–33.0)
MCHC: 32.2 g/dL (ref 31.5–35.7)
MCV: 87 fL (ref 79–97)
Monocytes Absolute: 0.4 10*3/uL (ref 0.1–0.9)
Monocytes: 10 %
Neutrophils Absolute: 2.1 10*3/uL (ref 1.4–7.0)
Neutrophils: 49 %
Platelets: 176 10*3/uL (ref 150–450)
RBC: 4.28 x10E6/uL (ref 3.77–5.28)
RDW: 12.4 % (ref 11.7–15.4)
WBC: 4.3 10*3/uL (ref 3.4–10.8)

## 2023-09-03 LAB — VITAMIN D 25 HYDROXY (VIT D DEFICIENCY, FRACTURES): Vit D, 25-Hydroxy: 55.3 ng/mL (ref 30.0–100.0)

## 2023-09-03 LAB — TSH: TSH: 0.772 u[IU]/mL (ref 0.450–4.500)

## 2023-09-04 ENCOUNTER — Encounter: Payer: Self-pay | Admitting: Physician Assistant

## 2023-09-04 NOTE — Progress Notes (Signed)
 Jean Yu,   Kidney, liver, glucose looks great!   Thyroid normal.   Hemoglobin and WBC look great.   Vitamin D normal.   HDL, good cholesterol, looks GREAT.  LDL, bad cholesterol, not to goal.   Overall 10 year risk is still low. Continue with diet and exercise and will recheck in 1 year.   [name].The 10-year ASCVD risk score (Arnett DK, et al., 2019) is: 0.9%   Values used to calculate the score:     Age: 43 years     Sex: Female     Is Non-Hispanic African American: Yes     Diabetic: No     Tobacco smoker: No     Systolic Blood Pressure: 135 mmHg     Is BP treated: Yes     HDL Cholesterol: 89 mg/dL     Total Cholesterol: 221 mg/dL

## 2023-09-05 ENCOUNTER — Telehealth: Payer: Self-pay | Admitting: Physician Assistant

## 2023-09-05 NOTE — Telephone Encounter (Signed)
-----   Message from Nurse Gabriel Rainwater sent at 09/05/2023  3:52 PM EDT ----- Thank you Mickie Bail ----- Message ----- From: [name], PA-C Sent: 09/04/2023   9:12 AM EDT To: Elizabeth Palau, LPN  Can you help with getting patient and new CPAP machine? She uses nightly? Last sleep study 2018. She has had current machine over 7 years.

## 2023-09-05 NOTE — Telephone Encounter (Signed)
 Faxed request for sleep study results to [facility] at [phone].

## 2023-09-08 NOTE — Progress Notes (Signed)
 Jean Yu [place] is assisting with this order for patient.

## 2023-09-29 ENCOUNTER — Telehealth: Payer: Self-pay | Admitting: Physician Assistant

## 2023-09-29 NOTE — Telephone Encounter (Signed)
 Patient called into call center and stated she had fallen down steps and was purple on her left foot and toe, lower back and right lower leg is purple sore in multiple areas of her body I called and left a message to call back if she wants to schedule an appointment  FYI

## 2023-09-30 NOTE — Telephone Encounter (Signed)
 Patient informed. Was seen at urgent care yesterday - x-ray was done but nothing was broken. She was given a prescriptionf or muscle relaxer but she does no think she will pick this up . She is planning to just continue with the tylenol - she was given a note taking her out of work until Monday  4/ 7/25

## 2023-10-24 ENCOUNTER — Ambulatory Visit: Payer: Managed Care, Other (non HMO) | Admitting: Physician Assistant

## 2023-10-24 VITALS — BP 135/92 | HR 82 | Ht 71.0 in | Wt 255.0 lb

## 2023-10-24 DIAGNOSIS — R0989 Other specified symptoms and signs involving the circulatory and respiratory systems: Secondary | ICD-10-CM | POA: Diagnosis not present

## 2023-10-24 DIAGNOSIS — I10 Essential (primary) hypertension: Secondary | ICD-10-CM | POA: Diagnosis not present

## 2023-10-24 MED ORDER — METOPROLOL SUCCINATE ER 100 MG PO TB24: ORAL_TABLET | ORAL | Status: AC

## 2023-10-24 NOTE — Progress Notes (Unsigned)
   Established Patient Office Visit  Subjective   Patient ID: Jean Yu, female    DOB: 1980/12/09  Age: 43 y.o. MRN: [id number]  No chief complaint on file.   HPI  {History (Optional):23778}  ROS    Objective:     There were no vitals taken for this visit. {Vitals History (Optional):23777}  Physical Exam   No results found for any visits on 10/24/23.  {Labs (Optional):23779}  The 10-year ASCVD risk score (Arnett DK, et al., 2019) is: 1.3%    Assessment & Plan:   Problem List Items Addressed This Visit   None   No follow-ups on file.    Sinjin Amero, PA-C

## 2023-10-25 ENCOUNTER — Encounter: Payer: Self-pay | Admitting: Physician Assistant

## 2024-04-24 ENCOUNTER — Ambulatory Visit: Admitting: Physician Assistant

## 2024-04-24 DIAGNOSIS — I10 Essential (primary) hypertension: Secondary | ICD-10-CM

## 2024-04-24 DIAGNOSIS — R0989 Other specified symptoms and signs involving the circulatory and respiratory systems: Secondary | ICD-10-CM

## 2024-05-09 ENCOUNTER — Other Ambulatory Visit: Payer: Self-pay | Admitting: Physician Assistant

## 2024-05-09 DIAGNOSIS — I10 Essential (primary) hypertension: Secondary | ICD-10-CM

## 2024-05-15 ENCOUNTER — Telehealth: Payer: Self-pay

## 2024-05-15 DIAGNOSIS — Z1231 Encounter for screening mammogram for malignant neoplasm of breast: Secondary | ICD-10-CM

## 2024-05-15 NOTE — Telephone Encounter (Signed)
 Jean Yu agreed to have a mammogram. Order placed.

## 2024-06-05 ENCOUNTER — Encounter: Payer: Self-pay | Admitting: Physician Assistant

## 2024-06-05 ENCOUNTER — Ambulatory Visit: Admitting: Physician Assistant

## 2024-06-05 VITALS — BP 135/70 | HR 73 | Ht 71.0 in

## 2024-06-05 DIAGNOSIS — R1310 Dysphagia, unspecified: Secondary | ICD-10-CM

## 2024-06-05 DIAGNOSIS — G4733 Obstructive sleep apnea (adult) (pediatric): Secondary | ICD-10-CM

## 2024-06-05 DIAGNOSIS — R0989 Other specified symptoms and signs involving the circulatory and respiratory systems: Secondary | ICD-10-CM

## 2024-06-05 DIAGNOSIS — I10 Essential (primary) hypertension: Secondary | ICD-10-CM

## 2024-06-05 DIAGNOSIS — T148XXA Other injury of unspecified body region, initial encounter: Secondary | ICD-10-CM

## 2024-06-05 MED ORDER — OMEPRAZOLE 40 MG PO CPDR: 40.0000 mg | DELAYED_RELEASE_CAPSULE | Freq: Every day | ORAL | 2 refills | Status: AC

## 2024-06-05 NOTE — Progress Notes (Unsigned)
   Established Patient Office Visit  Subjective   Patient ID: Jean Yu, female    DOB: 1981/05/28  Age: 43 y.o. MRN: [id number]  Chief Complaint  Patient presents with   Medical Management of Chronic Issues    HPI   ROS    Objective:     BP (!) 156/97   Pulse 73   Ht 5' 11 (1.803 m)   SpO2 99%   BMI 35.57 kg/m  BP Readings from Last 3 Encounters:  06/05/24 (!) 156/97  10/24/23 (!) 135/92  08/31/23 135/82   Wt Readings from Last 3 Encounters:  10/24/23 255 lb (115.7 kg)  08/31/23 258 lb 12 oz (117.4 kg)  04/25/23 256 lb 4 oz (116.2 kg)      Physical Exam   The 10-year ASCVD risk score (Arnett DK, et al., 2019) is: 2.1%    Assessment & Plan:   Problem List Items Addressed This Visit       Unprioritized   Labile hypertension   OSA on CPAP - Primary    No follow-ups on file.    Bliss Tsang, PA-C

## 2024-06-05 NOTE — Patient Instructions (Addendum)
 Will order new home sleep study.  Start omeprazole  daily in the morning.  Will make referral to gastroenetrology for problems swallowing.  Will order u/s of neck Get mammogram scheduled

## 2024-06-06 ENCOUNTER — Ambulatory Visit: Payer: Self-pay | Admitting: Physician Assistant

## 2024-06-06 ENCOUNTER — Encounter: Payer: Self-pay | Admitting: Physician Assistant

## 2024-06-06 DIAGNOSIS — E042 Nontoxic multinodular goiter: Secondary | ICD-10-CM

## 2024-06-06 LAB — CMP14+EGFR
ALT: 12 IU/L (ref 0–32)
AST: 16 IU/L (ref 0–40)
Albumin: 4.4 g/dL (ref 3.9–4.9)
Alkaline Phosphatase: 57 IU/L (ref 41–116)
BUN/Creatinine Ratio: 18 (ref 9–23)
BUN: 13 mg/dL (ref 6–24)
Bilirubin Total: 0.6 mg/dL (ref 0.0–1.2)
CO2: 21 mmol/L (ref 20–29)
Calcium: 9.3 mg/dL (ref 8.7–10.2)
Chloride: 101 mmol/L (ref 96–106)
Creatinine, Ser: 0.72 mg/dL (ref 0.57–1.00)
Globulin, Total: 2.9 g/dL (ref 1.5–4.5)
Glucose: 80 mg/dL (ref 70–99)
Potassium: 3.7 mmol/L (ref 3.5–5.2)
Sodium: 138 mmol/L (ref 134–144)
Total Protein: 7.3 g/dL (ref 6.0–8.5)
eGFR: 106 mL/min/1.73 (ref >59)

## 2024-06-06 LAB — TSH+FREE T4
Free T4: 1.33 ng/dL (ref 0.82–1.77)
TSH: 0.572 u[IU]/mL (ref 0.450–4.500)

## 2024-06-06 LAB — CBC WITH DIFFERENTIAL/PLATELET
Basophils Absolute: 0 x10E3/uL (ref 0.0–0.2)
Basos: 1 %
EOS (ABSOLUTE): 0.1 x10E3/uL (ref 0.0–0.4)
Eos: 1 %
Hematocrit: 38.7 % (ref 34.0–46.6)
Hemoglobin: 12.7 g/dL (ref 11.1–15.9)
Immature Grans (Abs): 0 x10E3/uL (ref 0.0–0.1)
Immature Granulocytes: 0 %
Lymphocytes Absolute: 1.6 x10E3/uL (ref 0.7–3.1)
Lymphs: 38 %
MCH: 28.5 pg (ref 26.6–33.0)
MCHC: 32.8 g/dL (ref 31.5–35.7)
MCV: 87 fL (ref 79–97)
Monocytes Absolute: 0.4 x10E3/uL (ref 0.1–0.9)
Monocytes: 9 %
Neutrophils Absolute: 2.1 x10E3/uL (ref 1.4–7.0)
Neutrophils: 51 %
Platelets: 184 x10E3/uL (ref 150–450)
RBC: 4.46 x10E6/uL (ref 3.77–5.28)
RDW: 12 % (ref 11.7–15.4)
WBC: 4.1 x10E3/uL (ref 3.4–10.8)

## 2024-06-06 NOTE — Progress Notes (Signed)
 Labs look GREAT! No concerns.

## 2024-06-12 ENCOUNTER — Other Ambulatory Visit

## 2024-06-18 ENCOUNTER — Ambulatory Visit (HOSPITAL_BASED_OUTPATIENT_CLINIC_OR_DEPARTMENT_OTHER)
Admission: RE | Admit: 2024-06-18 | Discharge: 2024-06-18 | Disposition: A | Discharge status: Home / Self Care | Source: Ambulatory Visit | Attending: Physician Assistant | Admitting: Physician Assistant

## 2024-06-18 ENCOUNTER — Encounter (HOSPITAL_BASED_OUTPATIENT_CLINIC_OR_DEPARTMENT_OTHER): Payer: Self-pay

## 2024-06-18 DIAGNOSIS — Z1231 Encounter for screening mammogram for malignant neoplasm of breast: Secondary | ICD-10-CM | POA: Diagnosis present

## 2024-06-19 ENCOUNTER — Other Ambulatory Visit

## 2024-06-25 ENCOUNTER — Ambulatory Visit

## 2024-06-25 DIAGNOSIS — R09A2 Foreign body sensation, throat: Secondary | ICD-10-CM

## 2024-06-25 DIAGNOSIS — R1310 Dysphagia, unspecified: Secondary | ICD-10-CM

## 2024-06-26 ENCOUNTER — Ambulatory Visit: Payer: Self-pay | Admitting: Physician Assistant

## 2024-06-26 NOTE — Progress Notes (Signed)
 Normal mammogram. Follow up in 1 year.

## 2024-06-26 NOTE — Progress Notes (Signed)
 Sheka,   Home test was negative for sleep apnea.

## 2024-07-09 DIAGNOSIS — E042 Nontoxic multinodular goiter: Secondary | ICD-10-CM | POA: Insufficient documentation

## 2024-07-09 NOTE — Progress Notes (Signed)
 Sheka,   No neck masses or lymph node enlargement. You do have some thyroid  nodules but they look small. Suggested recheck with thyroid  u/s in 12 months. These should not be causing any of your problems swallowing.

## 2024-07-17 ENCOUNTER — Ambulatory Visit: Admitting: Urgent Care

## 2024-07-17 ENCOUNTER — Encounter: Payer: Self-pay | Admitting: Urgent Care

## 2024-07-17 VITALS — BP 117/82 | HR 61 | Ht 71.0 in | Wt 251.0 lb

## 2024-07-17 DIAGNOSIS — R0981 Nasal congestion: Secondary | ICD-10-CM

## 2024-07-17 DIAGNOSIS — Z96642 Presence of left artificial hip joint: Secondary | ICD-10-CM | POA: Diagnosis not present

## 2024-07-17 DIAGNOSIS — R600 Localized edema: Secondary | ICD-10-CM

## 2024-07-17 DIAGNOSIS — M79652 Pain in left thigh: Secondary | ICD-10-CM | POA: Diagnosis not present

## 2024-07-17 MED ORDER — FLUTICASONE PROPIONATE 50 MCG/ACT NA SUSP: 1.0000 | Freq: Every day | NASAL | 3 refills | Status: AC

## 2024-07-17 NOTE — Progress Notes (Unsigned)
" ° °  Established Patient Office Visit  Subjective:  Patient ID: Jean Yu, female    DOB: 04/22/1981  Age: 44 y.o. MRN: [id number]  Chief Complaint  Patient presents with   Leg Pain    Left x 5 days    HPI  ER 07/07/2024 Troponin, CBC, CMP, CT angio chest, CXR all normal  XR femur L - 1.  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 2.  No acute fracture or traumatic malalignment. 3.  Moderate left SI joint degenerative changes.  Xray pelvis 1.  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 2.  No acute fracture or traumatic malalignment. 3.  Similar cystic degenerative changes along the right ischial tuberosity which correlates with hamstring injury seen on MRI 12/04/2021. 4.  Mild right hip and moderate bilateral SI joint degenerative changes. 5.  Partially imaged moderate to severe lumbar spine degenerative changes.   US  peripheral venous unilateral L leg 1.  Anterior tibial, posterior tibial, and peroneal veins are not well evaluated. Within this limitation there is no evidence of venous thrombus within the thyroid  and leg. 2.  Subcutaneous edema throughout the leg without organized fluid collection.    {History (Optional):23778}  ROS: as noted in HPI  Objective:     BP 117/82   Pulse 61   Ht 5' 11 (1.803 m)   Wt 251 lb (113.9 kg)   LMP 06/04/2024 (Approximate)   SpO2 97%   BMI 35.01 kg/m  {Vitals History (Optional):23777}  Physical Exam   No results found for any visits on 07/17/24.  {Labs (Optional):23779}  The 10-year ASCVD risk score (Arnett DK, et al., 2019) is: 0.5%  Assessment & Plan:  There are no diagnoses linked to this encounter.   No follow-ups on file.   Jean LITTIE Gave, PA "

## 2024-07-18 ENCOUNTER — Ambulatory Visit: Payer: Self-pay | Admitting: Urgent Care

## 2024-07-18 ENCOUNTER — Encounter: Payer: Self-pay | Admitting: Urgent Care

## 2024-07-18 ENCOUNTER — Other Ambulatory Visit: Payer: Self-pay | Admitting: Physician Assistant

## 2024-07-18 ENCOUNTER — Other Ambulatory Visit

## 2024-07-18 DIAGNOSIS — Z96642 Presence of left artificial hip joint: Secondary | ICD-10-CM

## 2024-07-18 DIAGNOSIS — M79652 Pain in left thigh: Secondary | ICD-10-CM | POA: Diagnosis not present

## 2024-07-18 DIAGNOSIS — I10 Essential (primary) hypertension: Secondary | ICD-10-CM

## 2024-07-18 DIAGNOSIS — R600 Localized edema: Secondary | ICD-10-CM

## 2024-07-18 NOTE — Patient Instructions (Signed)
 Please perform CT scan of the left leg. This will further assess the cause of your symptoms. Use tylenol and/or ibuprofen  as needed for pain/ swelling.

## 2024-12-04 ENCOUNTER — Ambulatory Visit: Admitting: Physician Assistant
